# Patient Record
Sex: Male | Born: 1990 | Race: Black or African American | Hispanic: No | Marital: Single | Smoking: Never smoker
Health system: Southern US, Community
[De-identification: ages and names within clinical notes are randomized; demographics above are authoritative.]

## PROBLEM LIST (undated history)

## (undated) DIAGNOSIS — S022XXA Fracture of nasal bones, initial encounter for closed fracture: Secondary | ICD-10-CM

## (undated) DIAGNOSIS — S2231XA Fracture of one rib, right side, initial encounter for closed fracture: Secondary | ICD-10-CM

## (undated) DIAGNOSIS — F0781 Postconcussional syndrome: Secondary | ICD-10-CM

## (undated) DIAGNOSIS — S83006A Unspecified dislocation of unspecified patella, initial encounter: Secondary | ICD-10-CM

## (undated) DIAGNOSIS — Z8739 Personal history of other diseases of the musculoskeletal system and connective tissue: Secondary | ICD-10-CM

## (undated) DIAGNOSIS — J939 Pneumothorax, unspecified: Secondary | ICD-10-CM

## (undated) HISTORY — DX: Postconcussional syndrome: F07.81

## (undated) HISTORY — DX: Unspecified dislocation of unspecified patella, initial encounter: S83.006A

## (undated) HISTORY — DX: Fracture of one rib, right side, initial encounter for closed fracture: S22.31XA

## (undated) HISTORY — DX: Pneumothorax, unspecified: J93.9

## (undated) HISTORY — DX: Fracture of nasal bones, initial encounter for closed fracture: S02.2XXA

## (undated) HISTORY — DX: Personal history of other diseases of the musculoskeletal system and connective tissue: Z87.39

---
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
## 2011-01-01 NOTE — ED Provider Notes (Signed)
Medical screening examination/treatment/procedure(s) were performed by non-physician practitioner and as supervising physician I was immediately available for consultation/collaboration.  Sherina Stammer 01/01/11 0714 

## 2011-04-10 ENCOUNTER — Emergency Department (HOSPITAL_COMMUNITY): Payer: Managed Care, Other (non HMO)

## 2011-04-10 ENCOUNTER — Inpatient Hospital Stay (HOSPITAL_COMMUNITY): Payer: Managed Care, Other (non HMO)

## 2011-04-10 ENCOUNTER — Encounter (HOSPITAL_COMMUNITY): Payer: Self-pay

## 2011-04-10 ENCOUNTER — Emergency Department (HOSPITAL_COMMUNITY)
Admission: EM | Admit: 2011-04-10 | Discharge: 2011-04-10 | Disposition: A | Discharge status: Other Acute Inpatient Hospital | Payer: Managed Care, Other (non HMO) | Source: Home / Self Care

## 2011-04-10 ENCOUNTER — Inpatient Hospital Stay (HOSPITAL_COMMUNITY)
Admission: AD | Admit: 2011-04-10 | Discharge: 2011-04-15 | DRG: 200 | Disposition: A | Discharge status: Home / Self Care | Payer: Managed Care, Other (non HMO) | Source: Other Acute Inpatient Hospital | Attending: General Surgery | Admitting: General Surgery

## 2011-04-10 DIAGNOSIS — J939 Pneumothorax, unspecified: Secondary | ICD-10-CM | POA: Diagnosis present

## 2011-04-10 DIAGNOSIS — S2231XA Fracture of one rib, right side, initial encounter for closed fracture: Secondary | ICD-10-CM | POA: Diagnosis present

## 2011-04-10 DIAGNOSIS — S270XXA Traumatic pneumothorax, initial encounter: Secondary | ICD-10-CM

## 2011-04-10 DIAGNOSIS — R339 Retention of urine, unspecified: Secondary | ICD-10-CM | POA: Diagnosis not present

## 2011-04-10 DIAGNOSIS — S2239XA Fracture of one rib, unspecified side, initial encounter for closed fracture: Secondary | ICD-10-CM | POA: Diagnosis present

## 2011-04-10 DIAGNOSIS — S022XXA Fracture of nasal bones, initial encounter for closed fracture: Secondary | ICD-10-CM | POA: Diagnosis present

## 2011-04-10 DIAGNOSIS — Y92410 Unspecified street and highway as the place of occurrence of the external cause: Secondary | ICD-10-CM

## 2011-04-10 DIAGNOSIS — S0990XA Unspecified injury of head, initial encounter: Secondary | ICD-10-CM | POA: Insufficient documentation

## 2011-04-10 DIAGNOSIS — Y998 Other external cause status: Secondary | ICD-10-CM

## 2011-04-10 DIAGNOSIS — H6120 Impacted cerumen, unspecified ear: Secondary | ICD-10-CM | POA: Diagnosis present

## 2011-04-10 DIAGNOSIS — T07XXXA Unspecified multiple injuries, initial encounter: Secondary | ICD-10-CM | POA: Diagnosis present

## 2011-04-10 DIAGNOSIS — F0781 Postconcussional syndrome: Secondary | ICD-10-CM | POA: Diagnosis present

## 2011-04-10 DIAGNOSIS — R0989 Other specified symptoms and signs involving the circulatory and respiratory systems: Secondary | ICD-10-CM | POA: Insufficient documentation

## 2011-04-10 DIAGNOSIS — R0609 Other forms of dyspnea: Secondary | ICD-10-CM | POA: Insufficient documentation

## 2011-04-10 DIAGNOSIS — IMO0002 Reserved for concepts with insufficient information to code with codable children: Secondary | ICD-10-CM

## 2011-04-10 DIAGNOSIS — S060X0A Concussion without loss of consciousness, initial encounter: Secondary | ICD-10-CM | POA: Diagnosis present

## 2011-04-10 DIAGNOSIS — J9383 Other pneumothorax: Secondary | ICD-10-CM | POA: Insufficient documentation

## 2011-04-10 DIAGNOSIS — S060X9A Concussion with loss of consciousness of unspecified duration, initial encounter: Secondary | ICD-10-CM

## 2011-04-10 DIAGNOSIS — S01501A Unspecified open wound of lip, initial encounter: Secondary | ICD-10-CM | POA: Diagnosis present

## 2011-04-10 LAB — TYPE AND SCREEN
ABO/RH(D): O POS
Antibody Screen: NEGATIVE

## 2011-04-10 LAB — CBC
MCV: 87.5 fL (ref 78.0–100.0)
Platelets: 195 10*3/uL (ref 150–400)
RBC: 5.03 MIL/uL (ref 4.22–5.81)
RDW: 12.4 % (ref 11.5–15.5)
WBC: 8.5 10*3/uL (ref 4.0–10.5)

## 2011-04-10 LAB — COMPREHENSIVE METABOLIC PANEL
Albumin: 4.3 g/dL (ref 3.5–5.2)
Alkaline Phosphatase: 75 U/L (ref 39–117)
BUN: 9 mg/dL (ref 6–23)
Calcium: 9.1 mg/dL (ref 8.4–10.5)
Creatinine, Ser: 0.9 mg/dL (ref 0.50–1.35)
GFR calc Af Amer: >90 mL/min (ref >90)
Glucose, Bld: 122 mg/dL — ABNORMAL HIGH (ref 70–99)
Total Protein: 7.4 g/dL (ref 6.0–8.3)

## 2011-04-10 LAB — PROTIME-INR: Prothrombin Time: 14.7 seconds (ref 11.6–15.2)

## 2011-04-10 LAB — POCT I-STAT, CHEM 8
BUN: 9 mg/dL (ref 6–23)
Calcium, Ion: 1.16 mmol/L (ref 1.12–1.32)
HCT: 47 % (ref 39.0–52.0)
Hemoglobin: 16 g/dL (ref 13.0–17.0)
Sodium: 142 mEq/L (ref 135–145)
TCO2: 25 mmol/L (ref 0–100)

## 2011-04-10 LAB — ABO/RH: ABO/RH(D): O POS

## 2011-04-10 LAB — LACTIC ACID, PLASMA: Lactic Acid, Venous: 2.2 mmol/L (ref 0.5–2.2)

## 2011-04-10 MED ORDER — IOHEXOL 300 MG/ML  SOLN: 100.0000 mL | Freq: Once | INTRAMUSCULAR | Status: DC | PRN

## 2011-04-11 ENCOUNTER — Inpatient Hospital Stay (HOSPITAL_COMMUNITY): Payer: Managed Care, Other (non HMO)

## 2011-04-12 ENCOUNTER — Inpatient Hospital Stay (HOSPITAL_COMMUNITY): Payer: Managed Care, Other (non HMO)

## 2011-04-13 ENCOUNTER — Inpatient Hospital Stay (HOSPITAL_COMMUNITY): Payer: Managed Care, Other (non HMO)

## 2011-04-13 MED ORDER — ONDANSETRON HCL 4 MG/2ML IJ SOLN: 4.0000 mg | Freq: Four times a day (QID) | INTRAMUSCULAR | Status: DC | PRN

## 2011-04-13 MED ORDER — MORPHINE SULFATE 2 MG/ML IJ SOLN: 2.0000 mg | INTRAMUSCULAR | Status: DC | PRN

## 2011-04-13 MED ORDER — HYDROCODONE-ACETAMINOPHEN 5-325 MG PO TABS: 1.0000 | ORAL_TABLET | ORAL | Status: DC | PRN

## 2011-04-13 MED ORDER — SODIUM CHLORIDE 0.9 % IJ SOLN
3.0000 mL | Freq: Two times a day (BID) | INTRAMUSCULAR | Status: DC
  Administered 2011-04-14 (×2): 3 mL via INTRAVENOUS

## 2011-04-13 MED ORDER — ONDANSETRON HCL 4 MG PO TABS: 4.0000 mg | ORAL_TABLET | Freq: Four times a day (QID) | ORAL | Status: DC | PRN

## 2011-04-13 MED ORDER — HYDROCODONE-ACETAMINOPHEN 5-325 MG PO TABS: 2.0000 | ORAL_TABLET | ORAL | Status: DC | PRN

## 2011-04-13 MED ORDER — HYDROCODONE-ACETAMINOPHEN 5-325 MG PO TABS: 0.5000 | ORAL_TABLET | ORAL | Status: DC | PRN

## 2011-04-13 MED ORDER — ENOXAPARIN SODIUM 40 MG/0.4ML ~~LOC~~ SOLN
40.0000 mg | SUBCUTANEOUS | Status: DC
  Administered 2011-04-14: 40 mg via SUBCUTANEOUS
  Filled 2011-04-13 (×2): qty 0.4

## 2011-04-14 ENCOUNTER — Inpatient Hospital Stay (HOSPITAL_COMMUNITY): Payer: Managed Care, Other (non HMO)

## 2011-04-14 DIAGNOSIS — S022XXA Fracture of nasal bones, initial encounter for closed fracture: Secondary | ICD-10-CM

## 2011-04-14 DIAGNOSIS — S2231XA Fracture of one rib, right side, initial encounter for closed fracture: Secondary | ICD-10-CM

## 2011-04-14 DIAGNOSIS — F0781 Postconcussional syndrome: Secondary | ICD-10-CM

## 2011-04-14 DIAGNOSIS — J939 Pneumothorax, unspecified: Secondary | ICD-10-CM | POA: Diagnosis present

## 2011-04-14 HISTORY — DX: Pneumothorax, unspecified: J93.9

## 2011-04-14 HISTORY — DX: Fracture of one rib, right side, initial encounter for closed fracture: S22.31XA

## 2011-04-14 HISTORY — DX: Fracture of nasal bones, initial encounter for closed fracture: S02.2XXA

## 2011-04-14 HISTORY — DX: Postconcussional syndrome: F07.81
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
## 2011-04-15 ENCOUNTER — Inpatient Hospital Stay (HOSPITAL_COMMUNITY): Payer: Managed Care, Other (non HMO)

## 2011-04-15 NOTE — Discharge Summary (Signed)
This patient has been seen and I agree with the findings and treatment plan.  Kou Gucciardo O. Mekayla Soman, III, MD, FACS [phone] (pager) [phone] (direct pager) Trauma Surgeon  
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
## 2011-04-15 NOTE — Progress Notes (Signed)
FMLA paperwork completed for pt's mother. 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

## 2011-04-15 NOTE — Progress Notes (Signed)
Discharge instructions reviewed with pt and mother. Denies any questions. D/C instructions and f/u visit card given to pt. D/C'd to home via wheelchair.
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
## 2011-04-17 NOTE — Progress Notes (Signed)
Utilization review completed. Jermaine Matthew Diane11/12/2010  

## 2011-04-25 ENCOUNTER — Encounter (INDEPENDENT_AMBULATORY_CARE_PROVIDER_SITE_OTHER): Payer: Self-pay

## 2011-04-25 ENCOUNTER — Ambulatory Visit (INDEPENDENT_AMBULATORY_CARE_PROVIDER_SITE_OTHER): Payer: Managed Care, Other (non HMO) | Admitting: Orthopedic Surgery

## 2011-04-25 ENCOUNTER — Encounter: Payer: Self-pay | Admitting: Orthopedic Surgery

## 2011-04-25 DIAGNOSIS — J939 Pneumothorax, unspecified: Secondary | ICD-10-CM

## 2011-04-25 DIAGNOSIS — S2239XA Fracture of one rib, unspecified side, initial encounter for closed fracture: Secondary | ICD-10-CM

## 2011-04-25 DIAGNOSIS — J9383 Other pneumothorax: Secondary | ICD-10-CM

## 2011-04-25 DIAGNOSIS — S060X9A Concussion with loss of consciousness of unspecified duration, initial encounter: Secondary | ICD-10-CM

## 2011-04-25 DIAGNOSIS — S2231XA Fracture of one rib, right side, initial encounter for closed fracture: Secondary | ICD-10-CM

## 2011-04-25 DIAGNOSIS — S060XAA Concussion with loss of consciousness status unknown, initial encounter: Secondary | ICD-10-CM
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
## 2011-04-25 NOTE — Patient Instructions (Signed)
Wash wound daily and shower with soap and water. Cover with dry dressing.

## 2011-07-08 ENCOUNTER — Encounter (HOSPITAL_COMMUNITY): Payer: Self-pay | Admitting: Emergency Medicine

## 2011-07-08 ENCOUNTER — Emergency Department (HOSPITAL_COMMUNITY): Payer: BC Managed Care – PPO

## 2011-07-08 ENCOUNTER — Emergency Department (HOSPITAL_COMMUNITY)
Admission: EM | Admit: 2011-07-08 | Discharge: 2011-07-08 | Disposition: A | Discharge status: Home / Self Care | Payer: BC Managed Care – PPO | Attending: Emergency Medicine | Admitting: Emergency Medicine

## 2011-07-08 ENCOUNTER — Other Ambulatory Visit: Payer: Self-pay

## 2011-07-08 DIAGNOSIS — R0602 Shortness of breath: Secondary | ICD-10-CM | POA: Insufficient documentation

## 2011-07-08 NOTE — ED Notes (Signed)
Feels like it hard to take a deep breath  On and off for the last few days worse after leaving the gym this am
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
## 2011-08-07 ENCOUNTER — Emergency Department (HOSPITAL_COMMUNITY)
Admission: EM | Admit: 2011-08-07 | Discharge: 2011-08-07 | Disposition: A | Discharge status: Home / Self Care | Payer: Managed Care, Other (non HMO) | Attending: Emergency Medicine | Admitting: Emergency Medicine

## 2011-08-07 ENCOUNTER — Encounter (HOSPITAL_COMMUNITY): Payer: Self-pay | Admitting: *Deleted

## 2011-08-07 DIAGNOSIS — IMO0002 Reserved for concepts with insufficient information to code with codable children: Secondary | ICD-10-CM | POA: Insufficient documentation

## 2011-08-07 DIAGNOSIS — T1490XA Injury, unspecified, initial encounter: Secondary | ICD-10-CM | POA: Insufficient documentation

## 2011-08-07 DIAGNOSIS — H5789 Other specified disorders of eye and adnexa: Secondary | ICD-10-CM | POA: Insufficient documentation

## 2011-08-07 DIAGNOSIS — H579 Unspecified disorder of eye and adnexa: Secondary | ICD-10-CM | POA: Insufficient documentation

## 2011-08-07 MED ORDER — IBUPROFEN 600 MG PO TABS: 600.0000 mg | ORAL_TABLET | Freq: Four times a day (QID) | ORAL | Status: DC | PRN

## 2011-08-07 MED ORDER — TETRACAINE HCL 0.5 % OP SOLN
2.0000 [drp] | Freq: Once | OPHTHALMIC | Status: AC
  Administered 2011-08-07: 2 [drp] via OPHTHALMIC
  Filled 2011-08-07: qty 2

## 2011-08-07 MED ORDER — LACRI-LUBE S.O.P. OP OINT: 1.0000 [in_us] | TOPICAL_OINTMENT | Freq: Four times a day (QID) | OPHTHALMIC | Status: DC | PRN

## 2011-08-07 MED ORDER — FLUORESCEIN SODIUM 1 MG OP STRP
1.0000 | ORAL_STRIP | Freq: Once | OPHTHALMIC | Status: AC
  Administered 2011-08-07: 1 via OPHTHALMIC
  Filled 2011-08-07: qty 1

## 2011-08-07 MED ORDER — FLUORESCEIN SODIUM 1 MG OP STRP
ORAL_STRIP | OPHTHALMIC | Status: AC
  Filled 2011-08-07: qty 1
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
## 2011-08-07 NOTE — ED Notes (Signed)
Pt flushing out eye with NS.

## 2011-08-07 NOTE — ED Provider Notes (Signed)
  I performed a history and physical examination of Cecille Amsterdam and discussed his management with Dr. Fayrene Fearing.  I agree with the history, physical, assessment, and plan of care, with the following exceptions: None   I've seen and evaluated the patient who appears to be in no distress, with no erythema or swelling to the conjunctiva or surrounding tissues of the eye. He has no tearing, and appears to be in no distress with regard to his eyes. I examined the patient's eyes using tetracaine and fluorescein and slit lamp, and I see no corneal abrasions, corneal foreign bodies, or other abnormality.   I was present for the following procedures: None Time Spent in Critical Care of the patient: None Time spent in discussions with the patient and family: 10 minutes  Wister Hoefle D    Felisa Bonier, MD 08/07/11 [phone]
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
## 2011-08-08 NOTE — ED Provider Notes (Signed)
Evaluation and management procedures were performed by the PA/NP under my supervision/collaboration.  I evaluated this patient face-to-face at the time of encounter.  Please see my note dated at that time.   Kyelle Urbas D Andreea Arca, MD 08/08/11 0004 

## 2011-08-11 ENCOUNTER — Emergency Department (HOSPITAL_COMMUNITY)
Admission: EM | Admit: 2011-08-11 | Discharge: 2011-08-11 | Disposition: A | Discharge status: Home / Self Care | Payer: No Typology Code available for payment source | Attending: Emergency Medicine | Admitting: Emergency Medicine

## 2011-08-11 ENCOUNTER — Encounter (HOSPITAL_COMMUNITY): Payer: Self-pay | Admitting: *Deleted

## 2011-08-11 ENCOUNTER — Emergency Department (HOSPITAL_COMMUNITY): Payer: No Typology Code available for payment source

## 2011-08-11 DIAGNOSIS — M79609 Pain in unspecified limb: Secondary | ICD-10-CM | POA: Insufficient documentation

## 2011-08-11 DIAGNOSIS — S60459A Superficial foreign body of unspecified finger, initial encounter: Secondary | ICD-10-CM | POA: Insufficient documentation

## 2011-08-11 DIAGNOSIS — S60451A Superficial foreign body of left index finger, initial encounter: Secondary | ICD-10-CM

## 2011-08-11 MED ORDER — NAPROXEN 500 MG PO TABS: 500.0000 mg | ORAL_TABLET | Freq: Two times a day (BID) | ORAL | Status: AC

## 2011-08-11 NOTE — ED Notes (Signed)
Received bedside report from [place], [place].  Patient currently sitting up in bed; no respiratory or acute distress noted.  Patient updated on plan of care; informed patient that we are currently waiting on discharge paperwork from [facility].  Patient has no other questions or concerns at this time; will continue to monitor.

## 2011-08-11 NOTE — ED Notes (Signed)
Patient denies pain and is resting comfortably.  
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
## 2011-08-30 ENCOUNTER — Encounter (HOSPITAL_BASED_OUTPATIENT_CLINIC_OR_DEPARTMENT_OTHER): Payer: Self-pay

## 2011-08-30 ENCOUNTER — Emergency Department (HOSPITAL_BASED_OUTPATIENT_CLINIC_OR_DEPARTMENT_OTHER)
Admission: EM | Admit: 2011-08-30 | Discharge: 2011-08-30 | Disposition: A | Discharge status: Home / Self Care | Payer: Managed Care, Other (non HMO) | Attending: Emergency Medicine | Admitting: Emergency Medicine

## 2011-08-30 DIAGNOSIS — R1110 Vomiting, unspecified: Secondary | ICD-10-CM | POA: Insufficient documentation

## 2011-08-30 DIAGNOSIS — K529 Noninfective gastroenteritis and colitis, unspecified: Secondary | ICD-10-CM

## 2011-08-30 DIAGNOSIS — K5289 Other specified noninfective gastroenteritis and colitis: Secondary | ICD-10-CM | POA: Insufficient documentation

## 2011-08-30 DIAGNOSIS — R197 Diarrhea, unspecified: Secondary | ICD-10-CM | POA: Insufficient documentation

## 2011-08-30 MED ORDER — ONDANSETRON HCL 4 MG/2ML IJ SOLN
4.0000 mg | Freq: Once | INTRAMUSCULAR | Status: AC
  Administered 2011-08-30: 4 mg via INTRAVENOUS
  Filled 2011-08-30: qty 2

## 2011-08-30 MED ORDER — ONDANSETRON HCL 4 MG PO TABS: 4.0000 mg | ORAL_TABLET | Freq: Four times a day (QID) | ORAL | Status: AC

## 2011-08-30 MED ORDER — SODIUM CHLORIDE 0.9 % IV BOLUS (SEPSIS)
1000.0000 mL | Freq: Once | INTRAVENOUS | Status: AC
  Administered 2011-08-30: 1000 mL via INTRAVENOUS

## 2011-08-30 NOTE — ED Provider Notes (Addendum)
History     CSN: [id number]  Arrival date & time 08/30/11  1655   First MD Initiated Contact with Patient 08/30/11 1706      No chief complaint on file.   (Consider location/radiation/quality/duration/timing/severity/associated sxs/prior treatment) HPI Comments: No hx of recent foreign travel, camping, or abx use.  Patient is a 21 y.o. male presenting with vomiting. The history is provided by the patient.  Emesis  This is a new problem. The current episode started yesterday. The problem occurs 5 to 10 times per day. The problem has been gradually improving. The emesis has an appearance of stomach contents. There has been no fever. Associated symptoms include diarrhea. Pertinent negatives include no abdominal pain, no arthralgias, no chills, no cough, no fever, no headaches, no myalgias and no URI. Risk factors include ill contacts (multiple family members with same symptoms).    Past Medical History  Diagnosis Date  . Pneumothorax   . Concussion     History reviewed. No pertinent past surgical history.  No family history on file.  History  Substance Use Topics  . Smoking status: Never Smoker   . Smokeless tobacco: Never Used  . Alcohol Use: No      Review of Systems  Constitutional: Negative for fever, chills, diaphoresis and fatigue.  HENT: Negative for congestion, rhinorrhea and sneezing.   Eyes: Negative.   Respiratory: Negative for cough, chest tightness and shortness of breath.   Cardiovascular: Negative for chest pain and leg swelling.  Gastrointestinal: Positive for nausea, vomiting and diarrhea. Negative for abdominal pain and blood in stool.  Genitourinary: Negative for frequency, hematuria, flank pain and difficulty urinating.  Musculoskeletal: Negative for myalgias, back pain and arthralgias.  Skin: Negative for rash.  Neurological: Negative for dizziness, speech difficulty, weakness, numbness and headaches.    Allergies  Theraflu flu &  Home  Medications   Current Outpatient Rx  Name Route Sig Dispense Refill  . NAPROXEN 500 MG PO TABS Oral Take 1 tablet (500 mg total) by mouth 2 (two) times daily. 30 tablet 0  . ONDANSETRON HCL 4 MG PO TABS Oral Take 1 tablet (4 mg total) by mouth every 6 (six) hours. 12 tablet 0    BP 112/65  Pulse 88  Temp(Src) 98.9 F (37.2 C) (Oral)  Resp 16  Ht 5' 10.5" (1.791 m)  Wt 155 lb (70.308 kg)  BMI 21.93 kg/m2  SpO2 99%  Physical Exam  Constitutional: He is oriented to person, place, and time. He appears well-developed and well-nourished.  HENT:  Head: Normocephalic and atraumatic.  Eyes: Pupils are equal, round, and reactive to light.  Neck: Normal range of motion. Neck supple.  Cardiovascular: Normal rate, regular rhythm and normal heart sounds.   Pulmonary/Chest: Effort normal and breath sounds normal. No respiratory distress. He has no wheezes. He has no rales. He exhibits no tenderness.  Abdominal: Soft. Bowel sounds are normal. There is no tenderness. There is no rebound and no guarding.  Musculoskeletal: Normal range of motion. He exhibits no edema.  Lymphadenopathy:    He has no cervical adenopathy.  Neurological: He is alert and oriented to person, place, and time.  Skin: Skin is warm and dry. No rash noted.  Psychiatric: He has a normal mood and affect.    ED Course  Procedures (including critical care time)  Labs Reviewed - No data to display No results found.   1. Gastroenteritis       MDM  Pt with symptoms consistent with gastroenteritis,  likely viral.  No abd pain on exam.  No bloody diarrhea.  Feeling better after IVFs and zofran.        Rolan Bucco, MD 08/30/11 1900  Rolan Bucco, MD 08/30/11 1901

## 2011-08-30 NOTE — Discharge Instructions (Signed)
B.R.A.T. Diet Your doctor has recommended the B.R.A.T. diet for you or your child until the condition improves. This is often used to help control diarrhea and vomiting symptoms. If you or your child can tolerate clear liquids, you may have:  Bananas.   Rice.   Applesauce.   Toast (and other simple starches such as crackers, potatoes, noodles).  Be sure to avoid dairy products, meats, and fatty foods until symptoms are better. Fruit juices such as apple, grape, and prune juice can make diarrhea worse. Avoid these. Continue this diet for 2 days or as instructed by your caregiver. Document Released: 05/27/2005 Document Revised: 05/16/2011 Document Reviewed: 11/13/2006 Exit[employer] Patient Information 2012 [employer].Viral Gastroenteritis Viral gastroenteritis is also known as stomach flu. This condition affects the stomach and intestinal tract. It can cause sudden diarrhea and vomiting. The illness typically lasts 3 to 8 days. Most people develop an immune response that eventually gets rid of the virus. While this natural response develops, the virus can make you quite ill. CAUSES  Many different viruses can cause gastroenteritis, such as rotavirus or noroviruses. You can catch one of these viruses by consuming contaminated food or water. You may also catch a virus by sharing utensils or other personal items with an infected person or by touching a contaminated surface. SYMPTOMS  The most common symptoms are diarrhea and vomiting. These problems can cause a severe loss of body fluids (dehydration) and a body salt (electrolyte) imbalance. Other symptoms may include:  Fever.   Headache.   Fatigue.   Abdominal pain.  DIAGNOSIS  Your caregiver can usually diagnose viral gastroenteritis based on your symptoms and a physical exam. A stool sample may also be taken to test for the presence of viruses or other infections. TREATMENT  This illness typically goes away on its own. Treatments are aimed  at rehydration. The most serious cases of viral gastroenteritis involve vomiting so severely that you are not able to keep fluids down. In these cases, fluids must be given through an intravenous line (IV). HOME CARE INSTRUCTIONS   Drink enough fluids to keep your urine clear or pale yellow. Drink small amounts of fluids frequently and increase the amounts as tolerated.   Ask your caregiver for specific rehydration instructions.   Avoid:   Foods high in sugar.   Alcohol.   Carbonated drinks.   Tobacco.   Juice.   Caffeine drinks.   Extremely hot or cold fluids.   Fatty, greasy foods.   Too much intake of anything at one time.   Dairy products until 24 to 48 hours after diarrhea stops.   You may consume probiotics. Probiotics are active cultures of beneficial bacteria. They may lessen the amount and number of diarrheal stools in adults. Probiotics can be found in yogurt with active cultures and in supplements.   Wash your hands well to avoid spreading the virus.   Only take over-the-counter or prescription medicines for pain, discomfort, or fever as directed by your caregiver. Do not give aspirin to children. Antidiarrheal medicines are not recommended.   Ask your caregiver if you should continue to take your regular prescribed and over-the-counter medicines.   Keep all follow-up appointments as directed by your caregiver.  SEEK IMMEDIATE MEDICAL CARE IF:   You are unable to keep fluids down.   You do not urinate at least once every 6 to 8 hours.   You develop shortness of breath.   You notice blood in your stool or vomit. This may   look like coffee grounds.   You have abdominal pain that increases or is concentrated in one small area (localized).   You have persistent vomiting or diarrhea.   You have a fever.   The patient is a child younger than 3 months, and he or she has a fever.   The patient is a child older than 3 months, and he or she has a fever and  persistent symptoms.   The patient is a child older than 3 months, and he or she has a fever and symptoms suddenly get worse.   The patient is a baby, and he or she has no tears when crying.  MAKE SURE YOU:   Understand these instructions.   Will watch your condition.   Will get help right away if you are not doing well or get worse.  Document Released: 05/27/2005 Document Revised: 05/16/2011 Document Reviewed: 03/13/2011 Exit[employer] Patient Information 2012 [employer]. 

## 2012-02-13 ENCOUNTER — Emergency Department (HOSPITAL_BASED_OUTPATIENT_CLINIC_OR_DEPARTMENT_OTHER): Payer: BC Managed Care – PPO

## 2012-02-13 ENCOUNTER — Encounter (HOSPITAL_BASED_OUTPATIENT_CLINIC_OR_DEPARTMENT_OTHER): Payer: Self-pay | Admitting: *Deleted

## 2012-02-13 ENCOUNTER — Emergency Department (HOSPITAL_BASED_OUTPATIENT_CLINIC_OR_DEPARTMENT_OTHER)
Admission: EM | Admit: 2012-02-13 | Discharge: 2012-02-13 | Disposition: A | Discharge status: Home / Self Care | Payer: BC Managed Care – PPO | Attending: Emergency Medicine | Admitting: Emergency Medicine

## 2012-02-13 DIAGNOSIS — M25469 Effusion, unspecified knee: Secondary | ICD-10-CM | POA: Insufficient documentation

## 2012-02-13 MED ORDER — IBUPROFEN 600 MG PO TABS: 600.0000 mg | ORAL_TABLET | Freq: Four times a day (QID) | ORAL | Status: AC | PRN

## 2012-02-13 NOTE — ED Notes (Signed)
Patient reports he has used crutches in the past; crutches adjusted for patient's height.  Patient ambulatory around the room; patient verbalized comfort with using the crutches.

## 2012-02-13 NOTE — ED Notes (Signed)
Pt reports having problems with his left knee "since 5th grade" when he dislocated his kneecap for the first time playing basketball. Pt states he was playing basketball yesterday and once again dislocated his left kneecap. Pt states "it pops back in on it's own, but I wonder if I need surgery"

## 2012-02-13 NOTE — ED Provider Notes (Signed)
History     CSN: [id number]  Arrival date & time 02/13/12  0905   First MD Initiated Contact with Patient 02/13/12 [phone]      Chief Complaint  Patient presents with  . Knee Pain     Patient is a 21 y.o. male presenting with knee pain. The history is provided by the patient.  Knee Pain This is a new problem. The current episode started yesterday. The problem occurs constantly. The problem has not changed since onset.Pertinent negatives include no chest pain and no shortness of breath. The symptoms are aggravated by standing. The symptoms are relieved by rest. He has tried rest for the symptoms. The treatment provided moderate relief.  pt reports playing basketball yesterday and twisted left knee and he thinks he dislocated patella, but he was able to reduce on his own and continue playing.  Denies direct trauma to knee.  He now reports swelling to knee.  No h/o knees surgery but reports h/o osgood schlatter disease   Past Medical History  Diagnosis Date  . Pneumothorax   . Concussion     No past surgical history on file.  No family history on file.  History  Substance Use Topics  . Smoking status: Never Smoker   . Smokeless tobacco: Never Used  . Alcohol Use: No      Review of Systems  Respiratory: Negative for shortness of breath.   Cardiovascular: Negative for chest pain.    Allergies  Pheniramine-pe-apap  Home Medications   Current Outpatient Rx  Name Route Sig Dispense Refill  . IBUPROFEN 600 MG PO TABS Oral Take 1 tablet (600 mg total) by mouth every 6 (six) hours as needed for pain. 30 tablet 0  . NAPROXEN 500 MG PO TABS Oral Take 1 tablet (500 mg total) by mouth 2 (two) times daily. 30 tablet 0    BP 122/79  Pulse 76  Temp 98.2 F (36.8 C) (Oral)  Resp 18  Ht 5' 11.5" (1.816 m)  Wt 162 lb (73.483 kg)  BMI 22.28 kg/m2  SpO2 100%  Physical Exam CONSTITUTIONAL: Well developed/well nourished HEAD AND FACE: Normocephalic/atraumatic EYES:  EOMI/PERRL ENMT: Mucous membranes moist NECK: supple no meningeal signs SPINE:entire spine nontender CV: S1/S2 noted, no murmurs/rubs/gallops noted LUNGS: Lungs are clear to auscultation bilaterally, no apparent distress ABDOMEN: soft, nontender, no rebound or guarding NEURO: Pt is awake/alert, moves all extremitiesx4 EXTREMITIES: pulses normal, full ROM.  Left knee effusion noted but no erythema or deformity. He has ROM of left knee and able to keep knee extended while hip flexed.  Left achilles intact.  Left ankle nontender SKIN: warm, color normal PSYCH: no abnormalities of mood noted  ED Course  Procedures   Labs Reviewed - No data to display Dg Knee Complete 4 Views Left  02/13/2012  *RADIOLOGY REPORT*  Clinical Data: Knee dislocation.  LEFT KNEE - COMPLETE 4+ VIEW  Comparison: Plain films left knee 10/09/2007.  Findings: Moderate joint effusion is seen. No fracture or dislocation is identified. The patella is centered over the lateral femoral trochlea. Old Candis Shine disease is identified.  IMPRESSION:  1. Moderate joint effusion. No fracture.  The patella is centered over the lateral femoral trochlea.  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   Original Report Authenticated By: Jermaine Bell. Obrien, M.D.      1. Knee effusion     Needs sports med f/u Given crutches and limit activity He did not want pain meds  MDM  Nursing notes including past  medical history and social history reviewed and considered in documentation xrays reviewed and considered         Jermaine Gaskins, MD 02/13/12 1030

## 2012-04-29 ENCOUNTER — Ambulatory Visit (INDEPENDENT_AMBULATORY_CARE_PROVIDER_SITE_OTHER): Payer: Self-pay | Admitting: Family Medicine

## 2012-04-29 ENCOUNTER — Encounter: Payer: Self-pay | Admitting: Family Medicine

## 2012-04-29 DIAGNOSIS — R5383 Other fatigue: Secondary | ICD-10-CM

## 2012-04-29 DIAGNOSIS — Z0289 Encounter for other administrative examinations: Secondary | ICD-10-CM

## 2012-04-29 DIAGNOSIS — R5381 Other malaise: Secondary | ICD-10-CM
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
## 2012-05-14 ENCOUNTER — Encounter (HOSPITAL_BASED_OUTPATIENT_CLINIC_OR_DEPARTMENT_OTHER): Payer: Self-pay | Admitting: *Deleted

## 2012-05-14 ENCOUNTER — Emergency Department (HOSPITAL_BASED_OUTPATIENT_CLINIC_OR_DEPARTMENT_OTHER)
Admission: EM | Admit: 2012-05-14 | Discharge: 2012-05-14 | Disposition: A | Discharge status: Home / Self Care | Payer: BC Managed Care – PPO | Attending: Emergency Medicine | Admitting: Emergency Medicine

## 2012-05-14 DIAGNOSIS — Z22322 Carrier or suspected carrier of Methicillin resistant Staphylococcus aureus: Secondary | ICD-10-CM | POA: Insufficient documentation

## 2012-05-14 DIAGNOSIS — Z8709 Personal history of other diseases of the respiratory system: Secondary | ICD-10-CM | POA: Insufficient documentation

## 2012-05-14 DIAGNOSIS — Z87828 Personal history of other (healed) physical injury and trauma: Secondary | ICD-10-CM | POA: Insufficient documentation

## 2012-05-14 DIAGNOSIS — L089 Local infection of the skin and subcutaneous tissue, unspecified: Secondary | ICD-10-CM

## 2012-05-14 MED ORDER — SULFAMETHOXAZOLE-TRIMETHOPRIM 800-160 MG PO TABS: 1.0000 | ORAL_TABLET | Freq: Two times a day (BID) | ORAL | Status: AC
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
## 2012-05-14 NOTE — ED Notes (Signed)
Multiple abscess on his arms and shoulders.

## 2012-05-15 NOTE — ED Provider Notes (Signed)
Medical screening examination/treatment/procedure(s) were performed by non-physician practitioner and as supervising physician I was immediately available for consultation/collaboration.   [name], MD 05/15/12 [phone]

## 2012-08-10 ENCOUNTER — Telehealth: Payer: Self-pay
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
## 2012-08-17 ENCOUNTER — Other Ambulatory Visit: Payer: Self-pay | Admitting: Family Medicine

## 2012-08-18 NOTE — Telephone Encounter (Signed)
Notified pt that letter is ready for p/up. Faxed letter to [facility] at fax # on [facility] letter and left copy for pt p/up.

## 2013-01-04 ENCOUNTER — Encounter (HOSPITAL_BASED_OUTPATIENT_CLINIC_OR_DEPARTMENT_OTHER): Payer: Self-pay | Admitting: Emergency Medicine

## 2013-01-04 ENCOUNTER — Emergency Department (HOSPITAL_BASED_OUTPATIENT_CLINIC_OR_DEPARTMENT_OTHER)
Admission: EM | Admit: 2013-01-04 | Discharge: 2013-01-04 | Disposition: A | Discharge status: Home / Self Care | Payer: BC Managed Care – PPO | Attending: Emergency Medicine | Admitting: Emergency Medicine

## 2013-01-04 DIAGNOSIS — Z8709 Personal history of other diseases of the respiratory system: Secondary | ICD-10-CM | POA: Insufficient documentation

## 2013-01-04 DIAGNOSIS — J029 Acute pharyngitis, unspecified: Secondary | ICD-10-CM

## 2013-01-04 MED ORDER — HYDROCODONE-ACETAMINOPHEN 7.5-325 MG/15ML PO SOLN
10.0000 mg/kg | Freq: Once | ORAL | Status: DC
  Filled 2013-01-04: qty 15

## 2013-01-04 MED ORDER — HYDROCODONE-ACETAMINOPHEN 7.5-325 MG/15ML PO SOLN: 10.0000 mL | Freq: Once | ORAL | Status: DC

## 2013-01-04 MED ORDER — HYDROCODONE-ACETAMINOPHEN 7.5-325 MG/15ML PO SOLN: 10.0000 mL | ORAL | Status: DC | PRN

## 2013-01-04 MED ORDER — HYDROCODONE-ACETAMINOPHEN 7.5-325 MG/15ML PO SOLN
10.0000 mL | Freq: Once | ORAL | Status: AC
  Administered 2013-01-04: 10 mL via ORAL

## 2013-01-04 NOTE — ED Notes (Signed)
Sore throat x2 days denies fever or body aches taking OTC Cepacol w/o relief reports increased pain with swallowing

## 2013-01-04 NOTE — ED Provider Notes (Signed)
  CSN: [id number]     Arrival date & time 01/04/13  0459 History     First MD Initiated Contact with Patient 01/04/13 [phone]     Chief Complaint  Patient presents with  . Sore Throat   (Consider location/radiation/quality/duration/timing/severity/associated sxs/prior Treatment) HPI This is a 22 year old male with a two-day history of sore throat. He states the pain is so severe that he has been unable to either drink the last 24 hours. The pain is worse with swallowing. He denies fever, nasal congestion, cough, shortness of breath, nausea, vomiting, abdominal pain or diarrhea. He has been taking Cepacol throat lozenges without relief. He is also having pain in his left jugulodigastric lymph node. This pain radiates to his left ear.   Past Medical History  Diagnosis Date  . Pneumothorax   . Concussion    History reviewed. No pertinent past surgical history. History reviewed. No pertinent family history. History  Substance Use Topics  . Smoking status: Never Smoker   . Smokeless tobacco: Never Used  . Alcohol Use: No    Review of Systems  All other systems reviewed and are negative.    Allergies  Pheniramine-pe-apap  Home Medications  No current outpatient prescriptions on file. BP 126/70  Pulse 64  Temp(Src) 98.3 F (36.8 C) (Oral)  Resp 16  Ht 5\' 10"  (1.778 m)  Wt 160 lb (72.576 kg)  BMI 22.96 kg/m2  SpO2 100%  Physical Exam General: Well-developed, well-nourished male in no acute distress; appearance consistent with age of record HENT: normocephalic, atraumatic; pharyngeal erythema without edema Eyes: pupils equal round and reactive to light; extraocular muscles intact Neck: supple; anterior cervical lymphadenopathy, left greater than right Heart: regular rate and rhythm Lungs: clear to auscultation bilaterally Abdomen: soft; nondistended; nontender; no masses or hepatosplenomegaly; bowel sounds present Extremities: No deformity; full range of motion; no  edema Neurologic: Awake, alert and oriented; motor function intact in all extremities and symmetric; no facial droop Skin: Warm and dry Psychiatric: Normal mood and affect    ED Course   Procedures (including critical care time)    MDM   Nursing notes and vitals signs, including pulse oximetry, reviewed.  Summary of this visit's results, reviewed by myself:  Labs:  Results for orders placed during the hospital encounter of 01/04/13 (from the past 24 hour(s))  RAPID STREP SCREEN     Status: None   Collection Time    01/04/13  5:19 AM      Result Value Range   Streptococcus, Group A Screen (Direct) NEGATIVE  NEGATIVE     Hanley Seamen, MD 01/04/13 [phone]

## 2013-01-06 LAB — CULTURE, GROUP A STREP

## 2013-02-02 ENCOUNTER — Ambulatory Visit (INDEPENDENT_AMBULATORY_CARE_PROVIDER_SITE_OTHER): Payer: BC Managed Care – PPO | Admitting: Family Medicine

## 2013-02-02 DIAGNOSIS — S270XXA Traumatic pneumothorax, initial encounter: Secondary | ICD-10-CM

## 2013-02-02 NOTE — Patient Instructions (Signed)
Call to schedule a physical at your convenience with Dr. Neva Seat.  If any other paperwork needed.
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
## 2013-09-22 ENCOUNTER — Emergency Department (HOSPITAL_BASED_OUTPATIENT_CLINIC_OR_DEPARTMENT_OTHER): Payer: BC Managed Care – PPO

## 2013-09-22 ENCOUNTER — Encounter (HOSPITAL_BASED_OUTPATIENT_CLINIC_OR_DEPARTMENT_OTHER): Payer: Self-pay | Admitting: Emergency Medicine

## 2013-09-22 ENCOUNTER — Emergency Department (HOSPITAL_BASED_OUTPATIENT_CLINIC_OR_DEPARTMENT_OTHER)
Admission: EM | Admit: 2013-09-22 | Discharge: 2013-09-22 | Disposition: A | Discharge status: Home / Self Care | Payer: BC Managed Care – PPO | Attending: Emergency Medicine | Admitting: Emergency Medicine

## 2013-09-22 DIAGNOSIS — J9801 Acute bronchospasm: Secondary | ICD-10-CM | POA: Insufficient documentation

## 2013-09-22 DIAGNOSIS — Z87828 Personal history of other (healed) physical injury and trauma: Secondary | ICD-10-CM | POA: Insufficient documentation

## 2013-09-22 MED ORDER — ALBUTEROL SULFATE HFA 108 (90 BASE) MCG/ACT IN AERS
2.0000 | INHALATION_SPRAY | RESPIRATORY_TRACT | Status: DC | PRN
  Administered 2013-09-22: 2 via RESPIRATORY_TRACT
  Filled 2013-09-22: qty 6.7

## 2013-09-22 MED ORDER — CETIRIZINE HCL 10 MG PO TABS: 10.0000 mg | ORAL_TABLET | Freq: Every evening | ORAL | Status: DC | PRN

## 2013-09-22 NOTE — ED Provider Notes (Signed)
CSN: [id number]     Arrival date & time 09/22/13  0114 History   First MD Initiated Contact with Patient 09/22/13 [phone]     Chief Complaint  Patient presents with  . Shortness of Breath     (Consider location/radiation/quality/duration/timing/severity/associated sxs/prior Treatment) HPI This is a 23 year old male with a history of pneumothorax. He is here with difficulty taking a deep breath. He feels like he cannot pull in a full breath. He has had occasional cough which he attributes to seasonal allergies. He denies fever, chest pain, leg swelling or pain. Symptoms are moderate. He has no history of asthma and has never used an inhaled bronchodilator.  Past Medical History  Diagnosis Date  . Pneumothorax   . Concussion    History reviewed. No pertinent past surgical history. History reviewed. No pertinent family history. History  Substance Use Topics  . Smoking status: Never Smoker   . Smokeless tobacco: Never Used  . Alcohol Use: No    Review of Systems  All other systems reviewed and are negative.   Allergies  Pheniramine-pe-apap  Home Medications   Prior to Admission medications   Medication Sig Start Date End Date Taking? Authorizing Provider  HYDROcodone-acetaminophen (HYCET) 7.5-325 mg/15 ml solution Take 10 mLs by mouth every 4 (four) hours as needed for pain. 01/04/13   Tarence Searcy L Veronica Fretz, MD   BP 114/74  Pulse 73  Temp(Src) 97.8 F (36.6 C) (Oral)  Resp 16  Ht 5\' 10"  (1.778 m)  Wt 170 lb (77.111 kg)  BMI 24.39 kg/m2  SpO2 98%  Physical Exam General: Well-developed, well-nourished male in no acute distress; appearance consistent with age of record HENT: normocephalic; atraumatic; pharynx normal Eyes: pupils equal, round and reactive to light; extraocular muscles intact Neck: supple Heart: regular rate and rhythm; no murmurs, rubs or gallops Lungs: Mildly decreased air movement bilaterally without wheezing; coughing Abdomen: soft; nondistended; nontender;  bowel sounds present Extremities: No deformity; full range of motion; pulses normal; no edema Neurologic: Sleepy but arousable; motor function intact in all extremities and symmetric; no facial droop Skin: Warm and dry Psychiatric: Normal mood and affect    ED Course  Procedures (including critical care time) Nursing notes and vitals signs, including pulse oximetry, reviewed.  Summary of this visit's results, reviewed by myself:  Labs:  No results found for this or any previous visit (from the past 24 hour(s)).  Imaging Studies: Dg Chest 2 View  09/22/2013   CLINICAL DATA:  Shortness of breath and history of pneumothorax.  EXAM: CHEST - 2 VIEW  COMPARISON:  DG CHEST 2 VIEW dated 07/08/2011; DG CHEST 1V PORT dated 04/10/2011; DG CHEST 1V PORT dated 04/15/2011  FINDINGS: The heart size and mediastinal contours are within normal limits. Lung volumes are normal. There is no evidence of pulmonary edema, consolidation, pneumothorax, nodule or pleural fluid. The visualized skeletal structures are unremarkable.  IMPRESSION: No active disease.  No evidence of recurrent pneumothorax.   Electronically Signed   By: Irish LackGlenn  Yamagata M.D.   On: 09/22/2013 02:03   3:39 AM Suspect symptoms are due to seasonal allergies. We will provide him an inhaler and instruction on its use.     Hanley SeamenJohn L Charletta Voight, MD 09/22/13 [phone]

## 2013-09-22 NOTE — Discharge Instructions (Signed)
Bronchospasm, Adult °A bronchospasm is when the tubes that carry air in and out of your lungs (airwarys) spasm or tighten. During a bronchospasm it is hard to breathe. This is because the airways get smaller. A bronchospasm can be triggered by: °· Allergies. These may be to animals, pollen, food, or mold. °· Infection. This is a common cause of bronchospasm. °· Exercise. °· Irritants. These include pollution, cigarette smoke, strong odors, aerosol sprays, and paint fumes. °· Weather changes. °· Stress. °· Being emotional. °HOME CARE  °· Always have a plan for getting help. Know when to call your doctor and local emergency services (911 in the [place] Know where you can get emergency care. °· Only take medicines as told by your doctor. °· If you were prescribed an inhaler or nebulizer machine, ask your doctor how to use it correctly. Always use a spacer with your inhaler if you were given one. °· Stay calm during an attack. Try to relax and breathe more slowly. °· Control your home environment: °· Change your heating and air conditioning filter at least once a month. °· Limit your use of fireplaces and wood stoves. °· Do not  smoke. Do not  allow smoking in your home. °· Avoid perfumes and fragrances. °· Get rid of pests (such as roaches and mice) and their droppings. °· Throw away plants if you see mold on them. °· Keep your house clean and dust free. °· Replace carpet with wood, tile, or vinyl flooring. Carpet can trap dander and dust. °· Use allergy-proof pillows, mattress covers, and box spring covers. °· Wash bed sheets and blankets every week in hot water. Dry them in a dryer. °· Use blankets that are made of polyester or cotton. °· Wash hands frequently. °GET HELP IF: °· You have muscle aches. °· You have chest pain. °· The thick spit you spit or cough up (sputum) changes from clear or white to yellow, green, gray, or bloody. °· The thick spit you spit or cough up gets thicker. °· There are problems that may be  related to the medicine you are given such as: °· A rash. °· Itching. °· Swelling. °· Trouble breathing. °GET HELP RIGHT AWAY IF: °· You feel you cannot breathe or catch your breath. °· You cannot stop coughing. °· Your treatment is not helping you breathe better. °MAKE SURE YOU:  °· Understand these instructions. °· Will watch your condition. °· Will get help right away if you are not doing well or get worse. °Document Released: 03/24/2009 Document Revised: 01/27/2013 Document Reviewed: 11/17/2012 °ExitCare® Patient Information ©2014 [employer]. ° °

## 2013-09-22 NOTE — ED Notes (Signed)
Pt states he is unable to take a deep breath, denies cough or fever.

## 2013-11-20 ENCOUNTER — Encounter (HOSPITAL_BASED_OUTPATIENT_CLINIC_OR_DEPARTMENT_OTHER): Payer: Self-pay | Admitting: Emergency Medicine

## 2013-11-20 ENCOUNTER — Emergency Department (HOSPITAL_BASED_OUTPATIENT_CLINIC_OR_DEPARTMENT_OTHER): Payer: BC Managed Care – PPO

## 2013-11-20 ENCOUNTER — Emergency Department (HOSPITAL_BASED_OUTPATIENT_CLINIC_OR_DEPARTMENT_OTHER)
Admission: EM | Admit: 2013-11-20 | Discharge: 2013-11-20 | Disposition: A | Discharge status: Home / Self Care | Payer: BC Managed Care – PPO | Attending: Emergency Medicine | Admitting: Emergency Medicine

## 2013-11-20 DIAGNOSIS — Y9239 Other specified sports and athletic area as the place of occurrence of the external cause: Secondary | ICD-10-CM | POA: Insufficient documentation

## 2013-11-20 DIAGNOSIS — S6000XA Contusion of unspecified finger without damage to nail, initial encounter: Secondary | ICD-10-CM | POA: Insufficient documentation

## 2013-11-20 DIAGNOSIS — W219XXA Striking against or struck by unspecified sports equipment, initial encounter: Secondary | ICD-10-CM | POA: Insufficient documentation

## 2013-11-20 DIAGNOSIS — IMO0002 Reserved for concepts with insufficient information to code with codable children: Secondary | ICD-10-CM

## 2013-11-20 DIAGNOSIS — Y9367 Activity, basketball: Secondary | ICD-10-CM | POA: Insufficient documentation

## 2013-11-20 DIAGNOSIS — Z8709 Personal history of other diseases of the respiratory system: Secondary | ICD-10-CM | POA: Insufficient documentation

## 2013-11-20 DIAGNOSIS — Z791 Long term (current) use of non-steroidal anti-inflammatories (NSAID): Secondary | ICD-10-CM | POA: Insufficient documentation

## 2013-11-20 DIAGNOSIS — T148XXA Other injury of unspecified body region, initial encounter: Secondary | ICD-10-CM

## 2013-11-20 DIAGNOSIS — Z87828 Personal history of other (healed) physical injury and trauma: Secondary | ICD-10-CM | POA: Insufficient documentation

## 2013-11-20 DIAGNOSIS — Z79899 Other long term (current) drug therapy: Secondary | ICD-10-CM | POA: Insufficient documentation

## 2013-11-20 DIAGNOSIS — Y92838 Other recreation area as the place of occurrence of the external cause: Secondary | ICD-10-CM

## 2013-11-20 DIAGNOSIS — S63639A Sprain of interphalangeal joint of unspecified finger, initial encounter: Secondary | ICD-10-CM | POA: Insufficient documentation

## 2013-11-20 MED ORDER — IBUPROFEN 800 MG PO TABS
800.0000 mg | ORAL_TABLET | Freq: Once | ORAL | Status: AC
  Administered 2013-11-20: 800 mg via ORAL
  Filled 2013-11-20: qty 1

## 2013-11-20 MED ORDER — IBUPROFEN 800 MG PO TABS: 800.0000 mg | ORAL_TABLET | Freq: Three times a day (TID) | ORAL | Status: DC

## 2013-11-20 NOTE — Discharge Instructions (Signed)
Contusion  A contusion is a deep bruise. Contusions happen when an injury causes bleeding under the skin. Signs of bruising include pain, puffiness (swelling), and discolored skin. The contusion may turn blue, purple, or yellow.  HOME CARE   · Put ice on the injured area.  · Put ice in a plastic bag.  · Place a towel between your skin and the bag.  · Leave the ice on for 15-20 minutes, 03-04 times a day.  · Only take medicine as told by your doctor.  · Rest the injured area.  · If possible, raise (elevate) the injured area to lessen puffiness.  GET HELP RIGHT AWAY IF:   · You have more bruising or puffiness.  · You have pain that is getting worse.  · Your puffiness or pain is not helped by medicine.  MAKE SURE YOU:   · Understand these instructions.  · Will watch your condition.  · Will get help right away if you are not doing well or get worse.  Document Released: 11/13/2007 Document Revised: 08/19/2011 Document Reviewed: 04/01/2011  ExitCare® Patient Information ©2014 [employer].

## 2013-11-20 NOTE — ED Provider Notes (Addendum)
CSN: [id number]     Arrival date & time 11/20/13  0255 History   First MD Initiated Contact with Patient 11/20/13 0516     Chief Complaint  Patient presents with  . Hand Injury     (Consider location/radiation/quality/duration/timing/severity/associated sxs/prior Treatment) Patient is a [age] y.o. male presenting with hand injury. The history is provided by the patient.  Hand Injury Location:  Hand Time since incident:  8 days Injury: yes   Mechanism of injury comment:  Struck fifth knuckle on basketball 8 days ago.  States the tip of that finger has been crocked from old injury years ago Hand location:  R hand Pain details:    Quality:  Aching   Radiates to:  Does not radiate   Severity:  Moderate   Duration:  8 days   Timing:  Constant   Progression:  Unchanged Chronicity:  New Dislocation: no   Relieved by:  Nothing Worsened by:  Nothing tried Ineffective treatments:  None tried Associated symptoms: no back pain     Past Medical History  Diagnosis Date  . Pneumothorax   . Concussion    History reviewed. No pertinent past surgical history. History reviewed. No pertinent family history. History  Substance Use Topics  . Smoking status: Never Smoker   . Smokeless tobacco: Never Used  . Alcohol Use: No    Review of Systems  Musculoskeletal: Negative for back pain.  All other systems reviewed and are negative.     Allergies  Pheniramine-pe-apap  Home Medications   Prior to Admission medications   Medication Sig Start Date End Date Taking? Authorizing Provider  cetirizine (ZYRTEC) 10 MG tablet Take 1 tablet (10 mg total) by mouth at bedtime as needed for allergies. 09/22/13   Carlisle BeersJohn L Molpus, MD  HYDROcodone-acetaminophen (HYCET) 7.5-325 mg/15 ml solution Take 10 mLs by mouth every 4 (four) hours as needed for pain. 01/04/13   Carlisle BeersJohn L Molpus, MD  ibuprofen (ADVIL,MOTRIN) 800 MG tablet Take 1 tablet (800 mg total) by mouth 3 (three) times daily. 11/20/13   Sherrol Vicars K  Hibo Blasdell-Rasch, MD   BP 107/60  Pulse 62  Temp(Src) 98.1 F (36.7 C) (Oral)  Resp 16  Ht 5\' 10"  (1.778 m)  Wt 170 lb (77.111 kg)  BMI 24.39 kg/m2  SpO2 98% Physical Exam  Constitutional: He is oriented to person, place, and time. He appears well-developed and well-nourished. No distress.  HENT:  Head: Normocephalic and atraumatic.  Mouth/Throat: No oropharyngeal exudate.  Eyes: Conjunctivae are normal. Pupils are equal, round, and reactive to light.  Neck: Normal range of motion. Neck supple.  Cardiovascular: Normal rate, regular rhythm and intact distal pulses.   Pulmonary/Chest: Effort normal and breath sounds normal. He has no wheezes. He has no rales.  Abdominal: Soft. Bowel sounds are normal. There is no tenderness.  Musculoskeletal: Normal range of motion. He exhibits no edema.  Tenderness of the left fifth knuckle distal pinky is twisted and patient reports this is old. NVI left hand cap refill < 2 sec no snuff box tenderness FROM of the LUE  Neurological: He is alert and oriented to person, place, and time. He has normal reflexes.  Skin: Skin is warm and dry.  Psychiatric: He has a normal mood and affect.    ED Course  Procedures (including critical care time) Labs Review Labs Reviewed - No data to display  Imaging Review Dg Hand Complete Left  11/20/2013   CLINICAL DATA:  Injury to left hand, with left fifth  finger pain.  EXAM: LEFT HAND - COMPLETE 3+ VIEW  COMPARISON:  Left hand radiographs performed 08/11/2011  FINDINGS: The somewhat unusual flexed appearance of the fifth proximal interphalangeal joint may reflect underlying tendon injury or possibly mild underlying subluxation. No additional soft tissue abnormalities are seen. There is no definite evidence of fracture.  Visualized joint spaces are preserved. The carpal rows appear grossly intact, and demonstrate normal alignment.  IMPRESSION: Somewhat unusual flexed appearance of the fifth proximal interphalangeal joint  may reflect underlying tendon injury or possibly mild underlying subluxation. No evidence of fracture or dislocation.   Electronically Signed   By: Roanna RaiderJeffery  Chang M.D.   On: 11/20/2013 04:09     EKG Interpretation None      MDM   Final diagnoses:  Contusion  Tendon injury    Contusion and old tendon injury.  NSAIDs ice and elevation and splint.  Follow up with hand surgery regarding old tendon injury    Kennethia Lynes K Abril Cappiello-Rasch, MD 11/20/13 0549  Nalleli Largent K Jayleene Glaeser-Rasch, MD 11/21/13 [id number]

## 2013-11-20 NOTE — ED Notes (Signed)
inj to left hand playing basketball 2-3 days ago  Diff moving little finger

## 2013-11-20 NOTE — ED Notes (Signed)
inj ot left hand playing basketball,  Diff moving little fonger left hand

## 2013-11-20 NOTE — ED Notes (Signed)
Pt sleeping. 

## 2014-05-25 ENCOUNTER — Ambulatory Visit (INDEPENDENT_AMBULATORY_CARE_PROVIDER_SITE_OTHER): Payer: Self-pay | Admitting: Physician Assistant

## 2014-05-25 VITALS — BP 90/60 | HR 54 | Temp 98.1°F | Resp 16 | Ht 71.5 in | Wt 164.8 lb

## 2014-05-25 DIAGNOSIS — Z0000 Encounter for general adult medical examination without abnormal findings: Secondary | ICD-10-CM

## 2014-05-25 NOTE — Patient Instructions (Signed)
Our office will contact you to schedule a complete physical with Dr. Neva SeatGreene at your convenience.
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
## 2014-05-30 NOTE — Progress Notes (Signed)
LMVM advising patient that an appt for a cpe with Dr. Neva SeatGreene was scheduled for 10/03/2014 @ 1:30.

## 2014-09-14 ENCOUNTER — Encounter (HOSPITAL_COMMUNITY): Payer: Self-pay | Admitting: *Deleted

## 2014-09-14 ENCOUNTER — Emergency Department (HOSPITAL_COMMUNITY)
Admission: EM | Admit: 2014-09-14 | Discharge: 2014-09-14 | Disposition: A | Discharge status: Home / Self Care | Payer: BLUE CROSS/BLUE SHIELD | Attending: Emergency Medicine | Admitting: Emergency Medicine

## 2014-09-14 DIAGNOSIS — J029 Acute pharyngitis, unspecified: Secondary | ICD-10-CM

## 2014-09-14 DIAGNOSIS — Z8781 Personal history of (healed) traumatic fracture: Secondary | ICD-10-CM | POA: Diagnosis not present

## 2014-09-14 DIAGNOSIS — Z87828 Personal history of other (healed) physical injury and trauma: Secondary | ICD-10-CM | POA: Insufficient documentation

## 2014-09-14 DIAGNOSIS — Z8659 Personal history of other mental and behavioral disorders: Secondary | ICD-10-CM | POA: Diagnosis not present

## 2014-09-14 LAB — RAPID STREP SCREEN (MED CTR MEBANE ONLY): STREPTOCOCCUS, GROUP A SCREEN (DIRECT): NEGATIVE

## 2014-09-14 MED ORDER — IBUPROFEN 600 MG PO TABS: 600.0000 mg | ORAL_TABLET | Freq: Four times a day (QID) | ORAL | Status: DC | PRN

## 2014-09-14 NOTE — Discharge Instructions (Signed)
Pharyngitis °Pharyngitis is redness, pain, and swelling (inflammation) of your pharynx.  °CAUSES  °Pharyngitis is usually caused by infection. Most of the time, these infections are from viruses (viral) and are part of a cold. However, sometimes pharyngitis is caused by bacteria (bacterial). Pharyngitis can also be caused by allergies. Viral pharyngitis may be spread from person to person by coughing, sneezing, and personal items or utensils (cups, forks, spoons, toothbrushes). Bacterial pharyngitis may be spread from person to person by more intimate contact, such as kissing.  °SIGNS AND SYMPTOMS  °Symptoms of pharyngitis include:   °· Sore throat.   °· Tiredness (fatigue).   °· Low-grade fever.   °· Headache. °· Joint pain and muscle aches. °· Skin rashes. °· Swollen lymph nodes. °· Plaque-like film on throat or tonsils (often seen with bacterial pharyngitis). °DIAGNOSIS  °Your health care provider will ask you questions about your illness and your symptoms. Your medical history, along with a physical exam, is often all that is needed to diagnose pharyngitis. Sometimes, a rapid strep test is done. Other lab tests may also be done, depending on the suspected cause.  °TREATMENT  °Viral pharyngitis will usually get better in 3-4 days without the use of medicine. Bacterial pharyngitis is treated with medicines that kill germs (antibiotics).  °HOME CARE INSTRUCTIONS  °· Drink enough water and fluids to keep your urine clear or pale yellow.   °· Only take over-the-counter or prescription medicines as directed by your health care provider:   °¨ If you are prescribed antibiotics, make sure you finish them even if you start to feel better.   °¨ Do not take aspirin.   °· Get lots of rest.   °· Gargle with 8 oz of salt water (½ tsp of salt per 1 qt of water) as often as every 1-2 hours to soothe your throat.   °· Throat lozenges (if you are not at risk for choking) or sprays may be used to soothe your throat. °SEEK MEDICAL  CARE IF:  °· You have large, tender lumps in your neck. °· You have a rash. °· You cough up green, yellow-brown, or bloody spit. °SEEK IMMEDIATE MEDICAL CARE IF:  °· Your neck becomes stiff. °· You drool or are unable to swallow liquids. °· You vomit or are unable to keep medicines or liquids down. °· You have severe pain that does not go away with the use of recommended medicines. °· You have trouble breathing (not caused by a stuffy nose). °MAKE SURE YOU:  °· Understand these instructions. °· Will watch your condition. °· Will get help right away if you are not doing well or get worse. °Document Released: 05/27/2005 Document Revised: 03/17/2013 Document Reviewed: 02/01/2013 °ExitCare® Patient Information ©2015 [employer]. This information is not intended to replace advice given to you by your health care provider. Make sure you discuss any questions you have with your health care provider. ° ° °Emergency Department Resource Guide °1) Find a Doctor and Pay Out of Pocket °Although you won't have to find out who is covered by your insurance plan, it is a good idea to ask around and get recommendations. You will then need to call the office and see if the doctor you have chosen will accept you as a new patient and what types of options they offer for patients who are self-pay. Some doctors offer discounts or will set up payment plans for their patients who do not have insurance, but you will need to ask so you aren't surprised when you get to your appointment. ° °  2) Contact Your Local Health Department °Not all health departments have doctors that can see patients for sick visits, but many do, so it is worth a call to see if yours does. If you don't know where your local health department is, you can check in your phone book. The CDC also has a tool to help you locate your state's health department, and many state websites also have listings of all of their local health departments. ° °3) Find a Walk-in Clinic °If  your illness is not likely to be very severe or complicated, you may want to try a walk in clinic. These are popping up all over the country in pharmacies, drugstores, and shopping centers. They're usually staffed by nurse practitioners or physician assistants that have been trained to treat common illnesses and complaints. They're usually fairly quick and inexpensive. However, if you have serious medical issues or chronic medical problems, these are probably not your best option. ° °No Primary Care Doctor: °- Call Health Connect at  832-8000 - they can help you locate a primary care doctor that  accepts your insurance, provides certain services, etc. °- Physician Referral Service- 1-800-533-3463 ° °Chronic Pain Problems: °Organization         Address  Phone   Notes  °[facility]  (336) 297-2271 Patients need to be referred by their primary care doctor.  ° °Medication Assistance: °Organization         Address  Phone   Notes  °[place] [facility] [address] °[place], [place] [address] (336) 641-8030 --Must be a resident of [place] °-- Must have NO insurance coverage whatsoever (no Medicaid/ Medicare, etc.) °-- The pt. MUST have a primary care doctor that directs their care regularly and follows them in the community °  °[facility]  (866) 331-1348   °[employer]  (888) 892-1162   ° °Agencies that provide inexpensive medical care: °Organization         Address  Phone   Notes  °[facility]  (336) 832-8035   °[facility]    (336) 832-7272   °[facility] [address] °[place], [place] [address] (336) 832-4777   °[facility] [address], °[place] (336) 271-4999   °[facility]    (336) 373-0678   °[facility]    (336) 272-1050   °[facility] ° [address], [place] Phone:  (336) 832-4444, Fax:  (336) 832-4440 Hours of  Operation:  9 am - 6 pm, M-F.  Also accepts Medicaid/Medicare and self-pay.  ° [facility] ° [address], [place] Phone: (336) 832-3150, Fax: (336) [phone]. Hours of Operation:  8:30 am - 5:30 pm, M-F.  Also accepts Medicaid and self-pay.  °[facility] [place] [address], [place] Phone: (336) 878-6027   °[facility] [address], [place], [place] (336)723-1848, Ext. 123 Mondays & Thursdays: 7-9 AM.  First 15 patients are seen on a first come, first serve basis. °  ° °Medicaid-accepting [place] Providers: ° °Organization         Address  Phone   Notes  °[facility] [address], [place] (336) 641-2100 Also accepts self-pay patients.  °[facility] [address],  ° (336) 856-9996   °[facility] [address], [place] (336) 288-8857   °[facility] [address] [place] [address], [place] (336) 299-7000   °Veita Bland [address], [place]  ° (336) 373-1557 Only accepts [place] Access Medicaid patients after they have their name applied to their card.  ° °Self-Pay (no insurance) in [place]: ° °Organization         Address  Phone   Notes  °Sickle Cell Patients, [facility] [address], [place] (336) 832-1970   °[facility] [address], [place] (336) 832-4400   °[facility] ° [address], [place] (336) 992-4800   °[facility]/Dr. Osei-Bonsu ° [address] [place] [address], [place] or [address], [place] (336) 841-8500 Phone number for both [place] and [place] locations is the same.  °[facility] [address], [place] (336) 299-0000   °[facility] [address] [place] [address], [place] or [address] (336) 852-7530 °(336) 878-2260   °[facility] [address], [place] (336) 350-1642, phone; (336) 294-5005, fax Sees patients 1st and 3rd Saturday of every month.  Must not qualify for public or private insurance (i.e. Medicaid, Medicare, [place] Health Choice, Veterans' Benefits) • Household income should be no more than 200% of the poverty level •The clinic cannot treat you if you are pregnant or think you are pregnant • Sexually transmitted diseases are not treated at the clinic.  ° ° °Dental Care: °Organization         Address  Phone  Notes  °[facility] [address], [place] (336) 641-6152 Accepts children up to age 21 who are enrolled in Medicaid or [place] Health Choice; pregnant women with a Medicaid card; and children who have applied for Medicaid or [place] Health Choice, but were declined, whose parents can pay a reduced fee at time of service.  °[facility] [place]  [address], [place] (336) 641-7733 Accepts children up to age 21 who are enrolled in Medicaid or [place] Health Choice; pregnant women with a Medicaid card; and children who have applied for Medicaid or [place] Health Choice, but were declined, whose parents can pay a reduced fee at time of service.  °[facility] ° [address], [place] (336) 641-4533 Patients are seen by appointment only. Walk-ins are not accepted. [facility] will see patients 18 years of age and older. °Monday - Tuesday (8am-5pm) °Most Wednesdays (8:30-5pm) °$30 per visit, cash only  °[facility] ° [address], [place] (336) 641-4533 Patients are seen by appointment only. Walk-ins are not accepted. [facility] will see patients 18 years of age and older. °One Wednesday Evening (Monthly: Volunteer Based).  $30 per visit, cash only  °[facility]  (919) 537-3737 for adults; Children under age 4, call Graduate Pediatric  Dentistry at (919) [phone]. Children aged 4-14, please call (919) [phone] to request a pediatric application. ° Dental services are provided in all areas of dental care including fillings, crowns and bridges, complete and partial dentures, implants, gum treatment, root canals, and extractions. Preventive care is also provided. Treatment is provided to both adults and children. °Patients are selected via a lottery and there is often a waiting list. °  °[facility] [address], °  [place] ° (336) 763-8833 [email or web address] °  °[facility] [address], [place], [place] (336)723-1848, Ext. 123 Second and Fourth Thursday of each month, opens at 6:30 AM; Clinic ends at 9 AM.  Patients are seen on a first-come first-served basis, and a limited number are seen during each clinic.  ° °[facility] ° [address], [place], [place] (336) 723-7904   Eligibility Requirements °You must have lived in [place], or [place] counties for at least the last three months. °  You cannot be eligible for state or federal sponsored healthcare insurance, including Veterans Administration, Medicaid, or Medicare. °  You generally cannot be eligible for healthcare insurance through your employer.  °  How to apply: °Eligibility screenings are held every Tuesday and Wednesday afternoon from 1:00 pm until 4:00 pm. You do not need an appointment for the interview!  °[facility] [address], [place], [place] 336-631-2330   °[facility]  336-342-8273   °[facility]  336-703-3100   °[facility]  336-570-6415   ° °Behavioral Health Resources in the Community: °Intensive Outpatient Programs °Organization         Address  Phone  Notes  °[facility] [address], [place], [place] 336-878-6098   °[facility] [address], [place], [place] 336-832-9800   °[facility] [address], [place], [place] ° 336-882-2125   °[facility] [address],  °[place], [place] 1-800-853-5163 or 336-641-4981   °Substance Abuse Resources °Organization         Address  Phone  Notes  °[facility]  336-882-2125   °[facility]  336-784-9470   °[facility]  336-285-9073   °[facility]  336-845-3988   °Residential & Outpatient Substance Abuse Program  1-800-659-3381   °Psychological Services °Organization         Address  Phone  Notes  °[facility]  336- 832-9600   °[facility]  336- 378-7881   °[facility] [address], [place] 1-800-853-5163 or 336-641-4981   ° °Mobile Crisis Teams °Organization         Address  Phone  Notes  °[facility], Mobile Crisis Care Unit  1-877-626-1772   °Assertive °[facility] ° [address] [place], [place] 336-834-9664   °Sharon DeEsch [address] °[place] 336-554-5454   ° °Self-Help/Support Groups °Organization         Address  Phone             Notes  °[facility] - variety of support groups  336- 373-1402 Call for more information  °Narcotics Anonymous (NA), [facility] [address], °[place]  2 meetings at this location  ° °Residential Treatment Programs °Organization         Address  Phone  Notes  °[facility] [address],    °[place]  1-866-801-8205   °[facility] ° [address], [place], [place] 704-293-8524   °[facility] [address], [place] 336-845-3988 Admissions: 8am-3pm M-F  °[facility] [address]    °[place], [place] 336-841-1104   °[facility] [address], [place], [place] 336-379-7146   °[facility] [address]  °[place], [place] 336-285-9073   °[facility] - Intensive Outpatient [address],  [place], [place] 336-852-3033   °[facility] [address]  °[place], [place] 1-877-615-2722 or 336-784-9470   °[facility] [address] [place], [place] 336-227-7417 Accepts Medicaid  °[facility] [address]  °[place] 1-800-659-3381 Substance Abuse/Addiction Treatment  ° °[place] Behavioral Health Resources °Organization         Address  Phone  Notes  °[facility]  (888) 581-9988   °Julie Brannon, PhD [address] [place], [place]   (336) 349-5553 or (336) 951-0000   °[facility]   [address] °[place], [place] (336) 349-4454   °[facility] [address], [place], [place] (336) 342-8316 Insurance/Medicaid/sponsorship through [facility]  °[facility] [address]                                    [place], [place] (336) 342-8316 Therapy/tele-psych/case  °[facility] [address]  ° [place],  (336) 349-2233    °Dr. Arfeen  (336) 349-4544   °[facility] [place]  [employer] [place] [facility] 1) [address], [place] °2) [address], [place] °3)  [address], [place] (336) 349-3220 °(336) 342-7768 ° °(336) 342-8140   °[place] Child Abuse Hotline (336) 342-1394 or (336) 342-3537 (After Hours)    ° ° ° ° °

## 2014-09-14 NOTE — ED Provider Notes (Signed)
CSN: [id number]     Arrival date & time 09/14/14  1135 History  This chart was scribed for non-physician practitioner, Jinny Sanders, PA-C, working with Donnetta Hutching, MD by Charline Bills, ED Scribe. This patient was seen in room TR06C/TR06C and the patient's care was started at 12:44 PM.   Chief Complaint  Patient presents with  . Sore Throat   The history is provided by the patient. No language interpreter was used.   HPI Comments: Jermaine Obrien is a 24 y.o. male who presents to the Emergency Department complaining of persistent sore throat for the past week. He reports nasal congestion, cough, hoarseness that he attributes to environmental allergies. He denies fever, sinus pressure, rhinorrhea, loss of appetite. He has been treating with Zyrtec with mild relief.   Past Medical History  Diagnosis Date  . Pneumothorax   . Concussion   . Closed fracture of rib of right side 04/14/2011  . Concussion syndrome 04/14/2011  . Nasal fracture 04/14/2011  . Pneumothorax, left 04/14/2011   History reviewed. No pertinent past surgical history. History reviewed. No pertinent family history. History  Substance Use Topics  . Smoking status: Never Smoker   . Smokeless tobacco: Never Used  . Alcohol Use: No    Review of Systems  Constitutional: Negative for appetite change.  HENT: Positive for congestion, sore throat and voice change. Negative for rhinorrhea and sinus pressure.   Respiratory: Positive for cough.   Allergic/Immunologic: Positive for environmental allergies.   Allergies  Pheniramine-pe-apap   Home Medications   Prior to Admission medications   Medication Sig Start Date End Date Taking? Authorizing Provider  ibuprofen (ADVIL,MOTRIN) 600 MG tablet Take 1 tablet (600 mg total) by mouth every 6 (six) hours as needed. 09/14/14   Ladona Mow, PA-C   BP 113/71 mmHg  Pulse 70  Temp(Src) 98.1 F (36.7 C) (Oral)  Resp 18  Ht  (1.803 m)  Wt 170 lb (77.111 kg)  BMI 23.72 kg/m2   SpO2 95% Physical Exam  Constitutional: He is oriented to person, place, and time. He appears well-developed and well-nourished. No distress.  HENT:  Head: Normocephalic and atraumatic.  Right Ear: Tympanic membrane normal.  Left Ear: Tympanic membrane normal.  Mouth/Throat: Posterior oropharyngeal erythema present. No posterior oropharyngeal edema or tonsillar abscesses.  Nasal turbinates are erythematous with clear discharge bilaterally.   Eyes: Conjunctivae and EOM are normal.  Neck: Neck supple. No tracheal deviation present.  Cardiovascular: Normal rate and regular rhythm.   Pulmonary/Chest: Effort normal and breath sounds normal. No respiratory distress.  Musculoskeletal: Normal range of motion.  Lymphadenopathy:    He has no cervical adenopathy.  Neurological: He is alert and oriented to person, place, and time.  Skin: Skin is warm and dry.  Psychiatric: He has a normal mood and affect. His behavior is normal.  Nursing note and vitals reviewed.  ED Course  Procedures (including critical care time) DIAGNOSTIC STUDIES: Oxygen Saturation is 95% on RA, normal by my interpretation.    COORDINATION OF CARE: 12:52 PM-Discussed treatment plan which includes strep screen, continue Zyrtec, Tylenol and Motrin with pt at bedside and pt agreed to plan.   Labs Review Labs Reviewed  RAPID STREP SCREEN  CULTURE, GROUP A STREP   Imaging Review No results found.   EKG Interpretation None      MDM   Final diagnoses:  Viral pharyngitis   Patient signed symptoms consistent with a viral pharyngitis. Strep negative. No tonsillar exudate. Patient does not  meet [facility] criteria for empiric therapy for a possible bacterial pharyngitis. Throat culture sent. No concern for PTA or retropharyngeal abscess. Patient tolerating by mouth fluids well.  Discussed over-the-counter and symptomatic therapy with patient, and encouraged patient to follow up with a primary care provider, patient  verbalizes understanding and agreement of this plan. I encouraged patient to call or return to ER with any worsening of symptoms or should he have any questions or concerns.  I personally performed the services described in this documentation, which was scribed in my presence. The recorded information has been reviewed and is accurate.  BP 110/53 mmHg  Pulse 83  Temp(Src) 97.5 F (36.4 C) (Oral)  Resp 16  Ht 5\' 11"  (1.803 m)  Wt 170 lb (77.111 kg)  BMI 23.72 kg/m2  SpO2 98%  Signed,  Ladona MowJoe Weslee Prestage, PA-C 5:48 PM    Ladona MowJoe Tayson Schnelle, PA-C 09/14/14 1748  Donnetta HutchingBrian Cook, MD 09/15/14 [phone]

## 2014-09-14 NOTE — ED Notes (Signed)
Pt reports having intermittent hoarseness and sore throat x 1 week. Denies difficulty swallowing or fever. Airway intact.

## 2014-09-14 NOTE — ED Notes (Signed)
Pt here for c/o sore throat, nasal congestion, cough, weakness-x 1 week. Denies N/V.

## 2014-09-16 LAB — CULTURE, GROUP A STREP: STREP A CULTURE: NEGATIVE

## 2014-10-03 ENCOUNTER — Encounter: Payer: BC Managed Care – PPO | Admitting: Family Medicine

## 2014-10-05 NOTE — Progress Notes (Signed)
This encounter was created in error - please disregard.

## 2016-10-26 ENCOUNTER — Ambulatory Visit: Payer: Self-pay | Admitting: Physician Assistant

## 2016-10-28 ENCOUNTER — Ambulatory Visit (INDEPENDENT_AMBULATORY_CARE_PROVIDER_SITE_OTHER): Payer: Self-pay | Admitting: Emergency Medicine

## 2016-10-28 ENCOUNTER — Encounter: Payer: Self-pay | Admitting: Emergency Medicine

## 2016-10-28 VITALS — BP 109/72 | HR 63 | Temp 98.5°F | Resp 16 | Ht 70.0 in | Wt 175.2 lb

## 2016-10-28 DIAGNOSIS — Z0000 Encounter for general adult medical examination without abnormal findings: Secondary | ICD-10-CM

## 2016-10-28 NOTE — Progress Notes (Signed)
Jermaine Obrien 26 y.o.   Chief Complaint  Patient presents with  . DMV px paperwork    HISTORY OF PRESENT ILLNESS: This is a 26 y.o. male here to have evaluation for fitness to drive; has DMV form to be filled out. No medical issues or concerns.  HPI   Prior to Admission medications   Medication Sig Start Date End Date Taking? Authorizing Provider  ibuprofen (ADVIL,MOTRIN) 600 MG tablet Take 1 tablet (600 mg total) by mouth every 6 (six) hours as needed. 09/14/14  Yes Ladona Mow, PA-C    Allergies  Allergen Reactions  . Pheniramine-Pe-Apap Hives and Other (See Comments)    Pt states allergy to only the wild berry flavor of theraflu    There are no active problems to display for this patient.   Past Medical History:  Diagnosis Date  . Closed fracture of rib of right side 04/14/2011  . Concussion   . Concussion syndrome 04/14/2011  . Nasal fracture 04/14/2011  . Pneumothorax   . Pneumothorax, left 04/14/2011    No past surgical history on file.  Social History   Social History  . Marital status: Single    Spouse name: N/A  . Number of children: 1  . Years of education: 12th grade   Occupational History  . Warden/ranger  . delivery driver     [employer]   Social History Main Topics  . Smoking status: Never Smoker  . Smokeless tobacco: Never Used  . Alcohol use No  . Drug use: No  . Sexual activity: Not on file   Other Topics Concern  . Not on file   Social History Narrative   Lives alone.   He shares custody of his son.    No family history on file.   Review of Systems  Constitutional: Negative.   HENT: Negative.   Eyes: Negative.   Respiratory: Negative.   Cardiovascular: Negative.   Gastrointestinal: Negative.   Genitourinary: Negative.   Musculoskeletal: Negative.   Skin: Negative.   Neurological: Negative.   Endo/Heme/Allergies: Negative.   Psychiatric/Behavioral: Negative.   All other systems reviewed and are negative.  Vitals:    10/28/16 0900  BP: 109/72  Pulse: 63  Resp: 16  Temp: 98.5 F (36.9 C)     Physical Exam  Constitutional: He is oriented to person, place, and time. He appears well-developed and well-nourished.  HENT:  Head: Normocephalic and atraumatic.  Nose: Nose normal.  Mouth/Throat: Oropharynx is clear and moist. No oropharyngeal exudate.  Eyes: Conjunctivae and EOM are normal. Pupils are equal, round, and reactive to light.  Neck: Normal range of motion. Neck supple. No JVD present. No thyromegaly present.  Cardiovascular: Normal rate, regular rhythm, normal heart sounds and intact distal pulses.   Pulmonary/Chest: Effort normal and breath sounds normal.  Abdominal: Soft. Bowel sounds are normal.  Musculoskeletal: Normal range of motion.  Lymphadenopathy:    He has no cervical adenopathy.  Neurological: He is alert and oriented to person, place, and time. He displays normal reflexes. No cranial nerve deficit or sensory deficit. He exhibits normal muscle tone. Coordination normal.  Skin: Skin is warm and dry. Capillary refill takes less than 2 seconds. No rash noted.  Psychiatric: He has a normal mood and affect. His behavior is normal.  Vitals reviewed.    ASSESSMENT & PLAN: [name] was seen today for dmv px paperwork.  Diagnoses and all orders for this visit:  Routine general medical examination  at a health care facility   Form filled out. Pt fit to drive.   Edwina BarthMiguel Haelie Clapp, MD [facility]

## 2016-10-28 NOTE — Patient Instructions (Signed)
     IF you received an x-ray today, you will receive an invoice from [facility]. Please contact [facility] at [phone] with questions or concerns regarding your invoice.   IF you received labwork today, you will receive an invoice from [facility]. Please contact [facility] at [phone] with questions or concerns regarding your invoice.   Our billing staff will not be able to assist you with questions regarding bills from these companies.  You will be contacted with the lab results as soon as they are available. The fastest way to get your results is to activate your My Chart account. Instructions are located on the last page of this paperwork. If you have not heard from us regarding the results in 2 weeks, please contact this office.     

## 2018-09-08 ENCOUNTER — Other Ambulatory Visit: Payer: Self-pay

## 2018-09-08 ENCOUNTER — Emergency Department (HOSPITAL_COMMUNITY)
Admission: EM | Admit: 2018-09-08 | Discharge: 2018-09-08 | Disposition: A | Discharge status: Home / Self Care | Payer: BLUE CROSS/BLUE SHIELD | Attending: Emergency Medicine | Admitting: Emergency Medicine

## 2018-09-08 ENCOUNTER — Encounter (HOSPITAL_COMMUNITY): Payer: Self-pay | Admitting: Emergency Medicine

## 2018-09-08 DIAGNOSIS — R059 Cough, unspecified: Secondary | ICD-10-CM

## 2018-09-08 DIAGNOSIS — Z79899 Other long term (current) drug therapy: Secondary | ICD-10-CM | POA: Insufficient documentation

## 2018-09-08 DIAGNOSIS — R05 Cough: Secondary | ICD-10-CM | POA: Insufficient documentation

## 2018-09-08 MED ORDER — ALBUTEROL SULFATE HFA 108 (90 BASE) MCG/ACT IN AERS
2.0000 | INHALATION_SPRAY | RESPIRATORY_TRACT | Status: DC
  Administered 2018-09-08: 2 via RESPIRATORY_TRACT
  Filled 2018-09-08: qty 6.7

## 2018-09-08 MED ORDER — AEROCHAMBER PLUS FLO-VU LARGE MISC
Status: AC
  Filled 2018-09-08: qty 1

## 2018-09-08 MED ORDER — AEROCHAMBER PLUS FLO-VU MEDIUM MISC
1.0000 | Freq: Once | Status: AC
  Administered 2018-09-08: 1
  Filled 2018-09-08: qty 1

## 2018-09-08 MED ORDER — ALBUTEROL SULFATE HFA 108 (90 BASE) MCG/ACT IN AERS: 2.0000 | INHALATION_SPRAY | RESPIRATORY_TRACT | 1 refills | Status: DC | PRN

## 2018-09-08 NOTE — ED Provider Notes (Signed)
[facility] EMERGENCY DEPARTMENT Provider Note   CSN: [id number] Arrival date & time: 09/08/18  1844    History   Chief Complaint Chief Complaint  Patient presents with  . Cough    HPI Jermaine Obrien is a 28 y.o. male with no significant past medical history other than seasonal allergies who presents today for evaluation of "feeling like my respiratory system is not working well."  He says that he started coughing last night and today has not felt short of breath however he feels like he cannot breathe as deeply as he usually can.  He denies any fevers.  He does not have any known contacts with anyone with coronavirus however he works at the [employer].  He denies any nasal congestion.  He reports that he takes medicines for seasonal allergies, and that he has not had any significant worsening of his nasal congestion or postnasal drainage.  He denies any chest pain.  No abdominal pain, nausea, vomiting, or diarrhea.     HPI  Past Medical History:  Diagnosis Date  . Closed fracture of rib of right side 04/14/2011  . Concussion   . Concussion syndrome 04/14/2011  . Nasal fracture 04/14/2011  . Pneumothorax   . Pneumothorax, left 04/14/2011    Patient Active Problem List   Diagnosis Date Noted  . Routine general medical examination at a health care facility 10/28/2016    History reviewed. No pertinent surgical history.      Home Medications    Prior to Admission medications   Medication Sig Start Date End Date Taking? Authorizing Provider  albuterol (PROVENTIL HFA;VENTOLIN HFA) 108 (90 Base) MCG/ACT inhaler Inhale 2 puffs into the lungs every 4 (four) hours as needed for wheezing or shortness of breath. 09/08/18   Cristina Gong, PA-C  ibuprofen (ADVIL,MOTRIN) 600 MG tablet Take 1 tablet (600 mg total) by mouth every 6 (six) hours as needed. 09/14/14   Ladona Mow, PA-C    Family History No family history on file.  Social History Social History    Tobacco Use  . Smoking status: Never Smoker  . Smokeless tobacco: Never Used  Substance Use Topics  . Alcohol use: No  . Drug use: No     Allergies   Pheniramine-pe-apap   Review of Systems Review of Systems  Constitutional: Negative for chills and fever.  Respiratory: Positive for cough.        Feels like he cannot take a full deep breath however clarifies that he is not short of breath.  Cardiovascular: Negative for chest pain, palpitations and leg swelling.  Gastrointestinal: Negative for abdominal pain, diarrhea, nausea and vomiting.  Musculoskeletal: Negative for neck pain.  All other systems reviewed and are negative.    Physical Exam Updated Vital Signs BP 118/85 (BP Location: Right Arm)   Pulse 64   Temp 98 F (36.7 C) (Oral)   Resp 16   SpO2 97%   Physical Exam Vitals signs and nursing note reviewed.  Constitutional:      General: He is not in acute distress.    Appearance: He is not toxic-appearing.  HENT:     Head: Normocephalic and atraumatic.     Mouth/Throat:     Mouth: Mucous membranes are moist.     Pharynx: No oropharyngeal exudate or posterior oropharyngeal erythema.  Eyes:     Conjunctiva/sclera: Conjunctivae normal.  Neck:     Musculoskeletal: Normal range of motion and neck supple. No neck rigidity.  Cardiovascular:  Rate and Rhythm: Normal rate.     Pulses: Normal pulses.     Heart sounds: Normal heart sounds.  Pulmonary:     Effort: Pulmonary effort is normal. No respiratory distress.     Breath sounds: Normal breath sounds. No stridor. No rhonchi or rales.  Abdominal:     General: Abdomen is flat.  Skin:    General: Skin is warm and dry.  Neurological:     General: No focal deficit present.     Mental Status: He is alert.  Psychiatric:        Mood and Affect: Mood normal.        Behavior: Behavior normal.      ED Treatments / Results  Labs (all labs ordered are listed, but only abnormal results are displayed) Labs  Reviewed - No data to display  EKG None  Radiology No results found.  Procedures Procedures (including critical care time)  Medications Ordered in ED Medications  AeroChamber Plus Flo-Vu Medium MISC 1 each (has no administration in time range)  albuterol (PROVENTIL HFA;VENTOLIN HFA) 108 (90 Base) MCG/ACT inhaler 2 puff (has no administration in time range)     Initial Impression / Assessment and Plan / ED Course  I have reviewed the triage vital signs and the nursing notes.  Pertinent labs & imaging results that were available during my care of the patient were reviewed by me and considered in my medical decision making (see chart for details).  Clinical Course as of Sep 08 1954  Tue Sep 08, 2018  1948 Spoke with ED pharmacist about allergy contraindication between patient's allergy of the wild berry flavor of TheraFlu and albuterol.  They stated that they are not aware of any contraindication with patient's allergy that would keep him from being able to get albuterol safely.   [EH]    Clinical Course User Index [EH] Cristina Gong, PA-C       Jermaine Obrien was evaluated in Emergency Department on 09/08/2018 for the symptoms described in the history of present illness. He was evaluated in the context of the global COVID-19 pandemic, which necessitated consideration that the patient might be at risk for infection with the SARS-CoV-2 virus that causes COVID-19. Institutional protocols and algorithms that pertain to the evaluation of patients at risk for COVID-19 are in a state of rapid change based on information released by regulatory bodies including the CDC and federal and state organizations. These policies and algorithms were followed during the patient's care in the ED.  Patient presents today for evaluation of dry cough and feeling like he cannot take is full of breath as he usually is able to.  He is currently treating himself at home for seasonal allergies.  His  symptoms started yesterday.  He does not have any known coronavirus contacts however he works at the post office.  He is generally well-appearing, he is not tachycardic or hypoxic.  He is afebrile and not tachypneic.  His lungs sound clear to auscultation bilaterally, however as he has allergies will give him a albuterol inhaler to see if that provides relief.  Albuterol inhaler and spacing chamber were ordered in the department.  Given that his cough is been present for under 24 hours, and he does not have any recent URI illness history I do not suspect pneumonia at this time.  Does not have any chest pain, no indication for EKG.  Suspect viral process.  Recommended conservative care, work note given.  Return precautions  were discussed with patient who states their understanding.  At the time of discharge patient denied any unaddressed complaints or concerns.  Patient is agreeable for discharge home.   Final Clinical Impressions(s) / ED Diagnoses   Final diagnoses:  Cough    ED Discharge Orders         Ordered    albuterol (PROVENTIL HFA;VENTOLIN HFA) 108 (90 Base) MCG/ACT inhaler  Every 4 hours PRN     09/08/18 1949           Norman Clay 09/08/18 1956    Shaune Pollack, MD 09/09/18 1204

## 2018-09-08 NOTE — ED Triage Notes (Signed)
Pt reports having a cough that started today. Pt worried about covid 19 stating one of his coworkers came in contact with someone who was positive. Pt unsure if his coworker is having any symptoms. Pt endorses some SOB that started today. Pt works at post office.

## 2018-09-08 NOTE — Discharge Instructions (Addendum)
Use your inhaler please take 2 puffs every 4 hours.  If needed you may take 2 puffs every hour, however if you find yourself needing to do this for more than 3 hours please return to the emergency room.  As we discussed today there is a chance that you may have coronavirus.  We are only able to test people who are sick enough to require admission at this time.  If your symptoms worsen, you have concerns, please reference the information I have given you and return to the emergency room if indicated.  Please take Ibuprofen (Advil, motrin) and Tylenol (acetaminophen) to relieve your pain.  You may take up to 600 MG (3 pills) of normal strength ibuprofen every 8 hours as needed.  In between doses of ibuprofen you make take tylenol, up to 1,000 mg (two extra strength pills).  Do not take more than 3,000 mg tylenol in a 24 hour period.  Please check all medication labels as many medications such as pain and cold medications may contain tylenol.  Do not drink alcohol while taking these medications.  Do not take other NSAID'S while taking ibuprofen (such as aleve or naproxen).  Please take ibuprofen with food to decrease stomach upset. Please try using Tylenol, if that does not work to control any aches or pains you may use ibuprofen also as needed.     Person Under Monitoring Name: Jermaine Obrien  Location: [address] [place] [address]   Infection Prevention Recommendations for Individuals Confirmed to have, or Being Evaluated for, 2019 Novel Coronavirus (COVID-19) Infection Who Receive Care at Home  Individuals who are confirmed to have, or are being evaluated for, COVID-19 should follow the prevention steps below until a healthcare provider or local or state health department says they can return to normal activities.  Stay home except to get medical care You should restrict activities outside your home, except for getting medical care. Do not go to work, school, or  public areas, and do not use public transportation or taxis.  Call ahead before visiting your doctor Before your medical appointment, call the healthcare provider and tell them that you have, or are being evaluated for, COVID-19 infection. This will help the healthcare providers office take steps to keep other people from getting infected. Ask your healthcare provider to call the local or state health department.  Monitor your symptoms Seek prompt medical attention if your illness is worsening (e.g., difficulty breathing). Before going to your medical appointment, call the healthcare provider and tell them that you have, or are being evaluated for, COVID-19 infection. Ask your healthcare provider to call the local or state health department.  Wear a facemask You should wear a facemask that covers your nose and mouth when you are in the same room with other people and when you visit a healthcare provider. People who live with or visit you should also wear a facemask while they are in the same room with you.  Separate yourself from other people in your home As much as possible, you should stay in a different room from other people in your home. Also, you should use a separate bathroom, if available.  Avoid sharing household items You should not share dishes, drinking glasses, cups, eating utensils, towels, bedding, or other items with other people in your home. After using these items, you should wash them thoroughly with soap and water.  Cover your coughs and sneezes Cover your mouth and nose with a tissue  when you cough or sneeze, or you can cough or sneeze into your sleeve. Throw used tissues in a lined trash can, and immediately wash your hands with soap and water for at least 20 seconds or use an alcohol-based hand rub.  Wash your [employer] your hands often and thoroughly with soap and water for at least 20 seconds. You can use an alcohol-based hand sanitizer if soap and water  are not available and if your hands are not visibly dirty. Avoid touching your eyes, nose, and mouth with unwashed hands.   Prevention Steps for Caregivers and Household Members of Individuals Confirmed to have, or Being Evaluated for, COVID-19 Infection Being Cared for in the Home  If you live with, or provide care at home for, a person confirmed to have, or being evaluated for, COVID-19 infection please follow these guidelines to prevent infection:  Follow healthcare providers instructions Make sure that you understand and can help the patient follow any healthcare provider instructions for all care.  Provide for the patients basic needs You should help the patient with basic needs in the home and provide support for getting groceries, prescriptions, and other personal needs.  Monitor the patients symptoms If they are getting sicker, call his or her medical provider and tell them that the patient has, or is being evaluated for, COVID-19 infection. This will help the healthcare providers office take steps to keep other people from getting infected. Ask the healthcare provider to call the local or state health department.  Limit the number of people who have contact with the patient If possible, have only one caregiver for the patient. Other household members should stay in another home or place of residence. If this is not possible, they should stay in another room, or be separated from the patient as much as possible. Use a separate bathroom, if available. Restrict visitors who do not have an essential need to be in the home.  Keep older adults, very young children, and other sick people away from the patient Keep older adults, very young children, and those who have compromised immune systems or chronic health conditions away from the patient. This includes people with chronic heart, lung, or kidney conditions, diabetes, and cancer.  Ensure good ventilation Make sure that  shared spaces in the home have good air flow, such as from an air conditioner or an opened window, weather permitting.  Wash your hands often Wash your hands often and thoroughly with soap and water for at least 20 seconds. You can use an alcohol based hand sanitizer if soap and water are not available and if your hands are not visibly dirty. Avoid touching your eyes, nose, and mouth with unwashed hands. Use disposable paper towels to dry your hands. If not available, use dedicated cloth towels and replace them when they become wet.  Wear a facemask and gloves Wear a disposable facemask at all times in the room and gloves when you touch or have contact with the patients blood, body fluids, and/or secretions or excretions, such as sweat, saliva, sputum, nasal mucus, vomit, urine, or feces.  Ensure the mask fits over your nose and mouth tightly, and do not touch it during use. Throw out disposable facemasks and gloves after using them. Do not reuse. Wash your hands immediately after removing your facemask and gloves. If your personal clothing becomes contaminated, carefully remove clothing and launder. Wash your hands after handling contaminated clothing. Place all used disposable facemasks, gloves, and other waste in a  lined container before disposing them with other household waste. Remove gloves and wash your hands immediately after handling these items.  Do not share dishes, glasses, or other household items with the patient Avoid sharing household items. You should not share dishes, drinking glasses, cups, eating utensils, towels, bedding, or other items with a patient who is confirmed to have, or being evaluated for, COVID-19 infection. After the person uses these items, you should wash them thoroughly with soap and water.  Wash laundry thoroughly Immediately remove and wash clothes or bedding that have blood, body fluids, and/or secretions or excretions, such as sweat, saliva, sputum,  nasal mucus, vomit, urine, or feces, on them. Wear gloves when handling laundry from the patient. Read and follow directions on labels of laundry or clothing items and detergent. In general, wash and dry with the warmest temperatures recommended on the label.  Clean all areas the individual has used often Clean all touchable surfaces, such as counters, tabletops, doorknobs, bathroom fixtures, toilets, phones, keyboards, tablets, and bedside tables, every day. Also, clean any surfaces that may have blood, body fluids, and/or secretions or excretions on them. Wear gloves when cleaning surfaces the patient has come in contact with. Use a diluted bleach solution (e.g., dilute bleach with 1 part bleach and 10 parts water) or a household disinfectant with a label that says EPA-registered for coronaviruses. To make a bleach solution at home, add 1 tablespoon of bleach to 1 quart (4 cups) of water. For a larger supply, add  cup of bleach to 1 gallon (16 cups) of water. Read labels of cleaning products and follow recommendations provided on product labels. Labels contain instructions for safe and effective use of the cleaning product including precautions you should take when applying the product, such as wearing gloves or eye protection and making sure you have good ventilation during use of the product. Remove gloves and wash hands immediately after cleaning.  Monitor yourself for signs and symptoms of illness Caregivers and household members are considered close contacts, should monitor their health, and will be asked to limit movement outside of the home to the extent possible. Follow the monitoring steps for close contacts listed on the symptom monitoring form.   ? If you have additional questions, contact your local health department or call the epidemiologist on call at [phone] (available 24/7). ? This guidance is subject to change. For the most up-to-date guidance from [facility], please refer to  their website: [email or web address]

## 2019-10-22 ENCOUNTER — Other Ambulatory Visit: Payer: Self-pay

## 2019-10-22 ENCOUNTER — Emergency Department (HOSPITAL_BASED_OUTPATIENT_CLINIC_OR_DEPARTMENT_OTHER)
Admission: EM | Admit: 2019-10-22 | Discharge: 2019-10-22 | Disposition: A | Discharge status: Home / Self Care | Attending: Emergency Medicine | Admitting: Emergency Medicine

## 2019-10-22 ENCOUNTER — Encounter (HOSPITAL_BASED_OUTPATIENT_CLINIC_OR_DEPARTMENT_OTHER): Payer: Self-pay | Admitting: Emergency Medicine

## 2019-10-22 DIAGNOSIS — Z79899 Other long term (current) drug therapy: Secondary | ICD-10-CM | POA: Insufficient documentation

## 2019-10-22 DIAGNOSIS — B354 Tinea corporis: Secondary | ICD-10-CM | POA: Insufficient documentation

## 2019-10-22 DIAGNOSIS — L089 Local infection of the skin and subcutaneous tissue, unspecified: Secondary | ICD-10-CM | POA: Insufficient documentation

## 2019-10-22 DIAGNOSIS — R21 Rash and other nonspecific skin eruption: Secondary | ICD-10-CM | POA: Diagnosis present

## 2019-10-22 MED ORDER — SULFAMETHOXAZOLE-TRIMETHOPRIM 800-160 MG PO TABS
1.0000 | ORAL_TABLET | Freq: Once | ORAL | Status: AC
  Administered 2019-10-22: 1 via ORAL
  Filled 2019-10-22: qty 1

## 2019-10-22 MED ORDER — IBUPROFEN 800 MG PO TABS
800.0000 mg | ORAL_TABLET | Freq: Once | ORAL | Status: DC
  Filled 2019-10-22: qty 1

## 2019-10-22 MED ORDER — SULFAMETHOXAZOLE-TRIMETHOPRIM 800-160 MG PO TABS: 1.0000 | ORAL_TABLET | Freq: Two times a day (BID) | ORAL | 0 refills | Status: AC

## 2019-10-22 NOTE — ED Triage Notes (Addendum)
Pt reports boil/abscess to anterior right leg. Denies fever, states looked like an ingrown hair a couple days ago.

## 2019-10-22 NOTE — ED Provider Notes (Signed)
[facility] EMERGENCY DEPARTMENT Provider Note   CSN: [id number] Arrival date & time: 10/22/19  0443     History Chief Complaint  Patient presents with  . Rash    Jermaine Obrien is a 29 y.o. male.  The history is provided by the patient.  Rash Location: right thigh anteriorly  Quality: painful and scaling   Quality: not blistering and not draining   Pain details:    Quality:  Aching   Severity:  Moderate   Onset quality:  Gradual   Duration:  2 days   Timing:  Constant   Progression:  Unchanged Severity:  Moderate Onset quality:  Gradual Duration:  2 days Timing:  Constant Progression:  Unchanged Chronicity:  New Context: not animal contact   Context comment:  Thought he had an ingrown hair on the thigh  Relieved by:  Nothing Worsened by:  Nothing Ineffective treatments:  None tried Associated symptoms: induration   Associated symptoms: no abdominal pain, no fever and no shortness of breath   Patient thought he had an ingrown hair now there is a small scab with surrounding redness.  There is also a ring lesion with a collarette of scale.      Past Medical History:  Diagnosis Date  . Closed fracture of rib of right side 04/14/2011  . Concussion   . Concussion syndrome 04/14/2011  . Nasal fracture 04/14/2011  . Pneumothorax   . Pneumothorax, left 04/14/2011    Patient Active Problem List   Diagnosis Date Noted  . Routine general medical examination at a health care facility 10/28/2016    History reviewed. No pertinent surgical history.     History reviewed. No pertinent family history.  Social History   Tobacco Use  . Smoking status: Never Smoker  . Smokeless tobacco: Never Used  Substance Use Topics  . Alcohol use: No  . Drug use: No    Home Medications Prior to Admission medications   Medication Sig Start Date End Date Taking? Authorizing Provider  albuterol (PROVENTIL HFA;VENTOLIN HFA) 108 (90 Base) MCG/ACT inhaler Inhale 2 puffs  into the lungs every 4 (four) hours as needed for wheezing or shortness of breath. 09/08/18   Cristina Gong, PA-C  ibuprofen (ADVIL,MOTRIN) 600 MG tablet Take 1 tablet (600 mg total) by mouth every 6 (six) hours as needed. 09/14/14   Ladona Mow, PA-C  sulfamethoxazole-trimethoprim (BACTRIM DS) 800-160 MG tablet Take 1 tablet by mouth 2 (two) times daily for 7 days. 10/22/19 10/29/19  Eleftheria Taborn, MD    Allergies    Pheniramine-pe-apap  Review of Systems   Review of Systems  Constitutional: Negative for fever.  HENT: Negative for congestion.   Eyes: Negative for visual disturbance.  Respiratory: Negative for shortness of breath.   Cardiovascular: Negative for chest pain.  Gastrointestinal: Negative for abdominal pain.  Genitourinary: Negative for difficulty urinating.  Skin: Positive for color change and rash.  Neurological: Negative for dizziness.  Psychiatric/Behavioral: Negative for agitation.  All other systems reviewed and are negative.   Physical Exam Updated Vital Signs BP 106/85 (BP Location: Right Arm)   Pulse 65   Temp 98.1 F (36.7 C) (Oral)   Resp 16   Ht 5' 10.5" (1.791 m)   Wt 88.5 kg   SpO2 98%   BMI 27.58 kg/m   Physical Exam Vitals and nursing note reviewed.  Constitutional:      General: He is not in acute distress.    Appearance: Normal appearance.  HENT:  Head: Normocephalic and atraumatic.     Nose: Nose normal.  Eyes:     Pupils: Pupils are equal, round, and reactive to light.  Cardiovascular:     Rate and Rhythm: Normal rate and regular rhythm.     Pulses: Normal pulses.     Heart sounds: Normal heart sounds.  Pulmonary:     Effort: Pulmonary effort is normal.     Breath sounds: Normal breath sounds.  Abdominal:     General: Abdomen is flat. Bowel sounds are normal.     Tenderness: There is no abdominal tenderness. There is no guarding.  Musculoskeletal:        General: Normal range of motion.     Cervical back: Normal range of  motion and neck supple.  Skin:    General: Skin is warm and dry.     Capillary Refill: Capillary refill takes less than 2 seconds.       Neurological:     General: No focal deficit present.     Mental Status: He is alert and oriented to person, place, and time.     Deep Tendon Reflexes: Reflexes normal.  Psychiatric:        Mood and Affect: Mood normal.        Behavior: Behavior normal.     ED Results / Procedures / Treatments   Labs (all labs ordered are listed, but only abnormal results are displayed) Labs Reviewed - No data to display  EKG None  Radiology No results found.  Procedures Procedures (including critical care time)  Medications Ordered in ED Medications  sulfamethoxazole-trimethoprim (BACTRIM DS) 800-160 MG per tablet 1 tablet (has no administration in time range)    ED Course  I have reviewed the triage vital signs and the nursing notes.  Pertinent labs & imaging results that were available during my care of the patient were reviewed by me and considered in my medical decision making (see chart for details).    Patient has a dehisced abscess with mild cellulitis and will treat with bactrim BS.  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  Patient verbalizes understanding and agrees to follow up.    Jermaine Obrien was evaluated in Emergency Department on 10/22/2019 for the symptoms described in the history of present illness. He was evaluated in the context of the global COVID-19 pandemic, which necessitated consideration that the patient might be at risk for infection with the SARS-CoV-2 virus that causes COVID-19. Institutional protocols and algorithms that pertain to the evaluation of patients at risk for COVID-19 are in a state of rapid change based on information released by regulatory bodies including the CDC and federal and state organizations. These policies and  algorithms were followed during the patient's care in the ED.  Final Clinical Impression(s) / ED Diagnoses Final diagnoses:  Skin infection  Tinea corporis  Return for intractable cough, coughing up blood,fevers >100.4 unrelieved by medication, shortness of breath, intractable vomiting, chest pain, shortness of breath, weakness,numbness, changes in speech, facial asymmetry,abdominal pain, passing out,Inability to tolerate liquids or food, cough, altered mental status or any concerns. No signs of systemic illness or infection. The patient is nontoxic-appearing on exam and vital signs are within normal limits.   I have reviewed the triage vital signs and the nursing notes. Pertinent labs &imaging results that were available during my care of the patient were reviewed by me  and considered in my medical decision making (see chart for details).After history, exam, and medical workup I feel the patient has beenappropriately medically screened and is safe for discharge home. Pertinent diagnoses were discussed with the patient. Patient was given return precautions.    Rx / DC Orders ED Discharge Orders         Ordered    sulfamethoxazole-trimethoprim (BACTRIM DS) 800-160 MG tablet  2 times daily     10/22/19 0455           Aviv Lengacher, MD 10/22/19 5364

## 2019-11-29 ENCOUNTER — Telehealth: Payer: Self-pay | Admitting: Family Medicine

## 2019-11-29 NOTE — Telephone Encounter (Signed)
Pt requesting that his 29 yr old son become new patient with Dr. Janice Norrie Danella Sensing 11/14/2014 [phone]

## 2019-11-30 NOTE — Telephone Encounter (Signed)
I'm fine with that. Ty.

## 2019-12-02 ENCOUNTER — Ambulatory Visit: Admitting: Medical

## 2020-01-04 ENCOUNTER — Ambulatory Visit: Admitting: Medical

## 2020-01-04 DIAGNOSIS — Z0289 Encounter for other administrative examinations: Secondary | ICD-10-CM

## 2020-02-07 ENCOUNTER — Other Ambulatory Visit: Payer: Self-pay

## 2020-02-07 ENCOUNTER — Ambulatory Visit (INDEPENDENT_AMBULATORY_CARE_PROVIDER_SITE_OTHER): Admitting: Family

## 2020-02-07 ENCOUNTER — Encounter: Payer: Self-pay | Admitting: Family

## 2020-02-07 VITALS — BP 120/74 | HR 62 | Temp 98.6°F | Resp 16 | Ht 70.5 in | Wt 190.4 lb

## 2020-02-07 DIAGNOSIS — S83006D Unspecified dislocation of unspecified patella, subsequent encounter: Secondary | ICD-10-CM

## 2020-02-07 DIAGNOSIS — M7021 Olecranon bursitis, right elbow: Secondary | ICD-10-CM | POA: Diagnosis not present

## 2020-02-07 DIAGNOSIS — M7701 Medial epicondylitis, right elbow: Secondary | ICD-10-CM

## 2020-02-07 DIAGNOSIS — Z8739 Personal history of other diseases of the musculoskeletal system and connective tissue: Secondary | ICD-10-CM | POA: Diagnosis not present

## 2020-02-07 MED ORDER — MELOXICAM 7.5 MG PO TABS: 7.5000 mg | ORAL_TABLET | Freq: Every day | ORAL | 0 refills | Status: DC

## 2020-02-07 NOTE — Patient Instructions (Signed)
You should be contacted about your referral to orthopedics. You may use meloxicam once daily as needed for elbow pain.  Welcome to [place]!
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
## 2020-02-28 ENCOUNTER — Ambulatory Visit: Payer: Self-pay

## 2020-02-28 ENCOUNTER — Other Ambulatory Visit: Payer: Self-pay

## 2020-02-28 ENCOUNTER — Encounter: Payer: Self-pay | Admitting: Orthopaedic Surgery

## 2020-02-28 ENCOUNTER — Ambulatory Visit (INDEPENDENT_AMBULATORY_CARE_PROVIDER_SITE_OTHER): Admitting: Orthopaedic Surgery

## 2020-02-28 DIAGNOSIS — M25562 Pain in left knee: Secondary | ICD-10-CM

## 2020-02-28 DIAGNOSIS — M25521 Pain in right elbow: Secondary | ICD-10-CM | POA: Diagnosis not present

## 2020-02-28 DIAGNOSIS — G8929 Other chronic pain: Secondary | ICD-10-CM

## 2020-02-28 MED ORDER — METHYLPREDNISOLONE 4 MG PO TABS: ORAL_TABLET | ORAL | 0 refills | Status: AC

## 2020-02-28 MED ORDER — DICLOFENAC SODIUM 75 MG PO TBEC: 75.0000 mg | DELAYED_RELEASE_TABLET | Freq: Two times a day (BID) | ORAL | 1 refills | Status: DC

## 2020-02-28 MED ORDER — MELOXICAM 7.5 MG PO TABS: 7.5000 mg | ORAL_TABLET | Freq: Every day | ORAL | 0 refills | Status: AC

## 2020-02-28 NOTE — Progress Notes (Signed)
Office Visit Note   Patient: Jermaine Obrien           Date of Birth: Feb 05, 1991           MRN: [id number] Visit Date: 02/28/2020              Requested by: Sandford Craze, NP [address] [place],  [place] [address] PCP: Sandford Craze, NP   Assessment & Plan: Visit Diagnoses:  1. Chronic pain of left knee   2. 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  I want him to rest from any high impact aerobic activities and want him to avoid any type of push-ups or pull-ups with the right arm.  I will start a 6-day steroid taper as well as some diclofenac.  We will see him back in return for the MRI of the left knee and will make further therapy plans accordingly.  All questions and concerns were answered and addressed.  Follow-Up Instructions: Return in about 2 weeks (around 03/13/2020).   Orders:  Orders Placed This Encounter  Procedures  . XR Knee 1-2 Views Left  . XR Elbow 2 Views Right   Meds ordered this encounter  Medications  . meloxicam (MOBIC) 7.5 MG tablet    Sig: Take 1 tablet (7.5 mg total) by mouth daily.    Dispense:  14 tablet    Refill:  0  . methylPREDNISolone (MEDROL) 4 MG tablet    Sig: Medrol dose pack. Take as instructed    Dispense:  21 tablet    Refill:  0  . diclofenac (VOLTAREN) 75 MG EC tablet    Sig: Take 1 tablet (75 mg total) by mouth 2 (two) times daily after a meal.    Dispense:  60 tablet    Refill:  1      Procedures: No procedures performed   Clinical Data: No additional findings.   Subjective: Chief Complaint  Patient presents with  . Right Elbow - Pain  The patient is a very pleasant 29 year old gentleman who comes in for evaluation treatment of right elbow pain but also left knee pain.  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  He said the pain really flared up  recently after training.  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  He also is right-hand dominant and points to pain around the triceps area and the medial epicondyle area of the right elbow is a source of his elbow pain.  He feels like the elbow is weak to him.  He denies any numbness and tingling in his hands or any instability of the knees other than the patella on the left side his been having more symptoms of instability and he gets a lot of swelling in his knees.  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  He is not a diabetic and has had no other acute change in medical status.  HPI  Review of Systems He currently denies any headache, chest pain, shortness of breath, fever, chills, nausea, vomiting  Objective: Vital Signs: There were no vitals taken for this visit.  Physical Exam He is alert and orient x3 and in no acute distress Ortho Exam  Examination of his right elbow does show some slight weakness and pain in the triceps near its insertion and there is pain over the just the medial epicondylar area of the elbow.  There is no pain over the cubital tunnel and a negative Tinel's sign.  His range of motion is full and the elbow feels ligamentously stable.  Examination of his left knee does show positive apprehension sign with the patella tracking laterally.  There is no knee joint effusion but definitely grinding of the patellofemoral joint which is significant. Specialty Comments:  No specialty comments available.  Imaging: XR Knee 1-2 Views Left  Result Date: 02/28/2020 2 views of the left knee show a laterally tracking patella and evidence of Osgood Slaughter as well as patellofemoral arthritic changes.  XR Elbow 2 Views Right  Result Date: 02/28/2020 2 views of the right elbow show no acute findings.    PMFS History: Patient Active Problem List   Diagnosis Date  Noted  . Routine general medical examination at a health care facility 10/28/2016   Past Medical History:  Diagnosis Date  . Closed fracture of rib of right side 04/14/2011  . Concussion   . Concussion syndrome 04/14/2011  . H/O Osgood-Schlatter disease   . Nasal fracture 04/14/2011  . Patellar dislocation   . Pneumothorax   . Pneumothorax, left 04/14/2011    Family History  Problem Relation Age of Onset  . Osteoarthritis Mother   . Diabetes Father   . Cancer Maternal Grandmother     No past surgical history on file. Social History   Occupational History  . Occupation: Brewing technologist: [employer]  . Occupation: Mudlogger  . Occupation: Electrical engineer  . Occupation: Company secretary reserve  Tobacco Use  . Smoking status: Never Smoker  . Smokeless tobacco: Never Used  Vaping Use  . Vaping Use: Never used  Substance and Sexual Activity  . Alcohol use: Yes    Alcohol/week: 1.0 standard drink    Types: 1 Shots of liquor per week    Comment: socially  . Drug use: No  . Sexual activity: Yes    Partners: Female

## 2020-03-08 ENCOUNTER — Other Ambulatory Visit: Payer: Self-pay

## 2020-03-08 ENCOUNTER — Ambulatory Visit (INDEPENDENT_AMBULATORY_CARE_PROVIDER_SITE_OTHER): Admitting: Family

## 2020-03-08 ENCOUNTER — Encounter: Payer: Self-pay | Admitting: Family

## 2020-03-08 VITALS — BP 113/72 | HR 50 | Temp 98.3°F | Resp 16 | Ht 70.5 in | Wt 186.0 lb

## 2020-03-08 DIAGNOSIS — R739 Hyperglycemia, unspecified: Secondary | ICD-10-CM

## 2020-03-08 DIAGNOSIS — Z0000 Encounter for general adult medical examination without abnormal findings: Secondary | ICD-10-CM | POA: Diagnosis not present

## 2020-03-08 DIAGNOSIS — M722 Plantar fascial fibromatosis: Secondary | ICD-10-CM

## 2020-03-08 NOTE — Progress Notes (Signed)
Subjective:    Patient ID: Jermaine Obrien, male    DOB: [birth year], 29 y.o.   MRN: [id number]  HPI  Patient presents today for complete physical.  Immunizations: He believes he had tetanus in the [employer] Diet: generally healthy Exercise:  Goes to the gym daily some cardio and weights Dental: up to date Vision: due  He c/o right heel pain, intermittent  Review of Systems  Constitutional: Negative for unexpected weight change.  HENT: Negative for hearing loss and rhinorrhea.        Reports occasional tinnitis after he was working around the [employer], wears Advertising copywriter Feels like hearing is OK  Eyes: Negative for visual disturbance.  Respiratory: Negative for cough and shortness of breath.   Cardiovascular: Negative for chest pain.  Gastrointestinal: Negative for blood in stool, constipation and diarrhea.  Genitourinary: Negative for difficulty urinating and frequency.  Musculoskeletal: Negative for arthralgias (hx of knee and elbow issues) and myalgias.       Some occasional right heel pain,    Neurological: Positive for headaches (very rarely).  Hematological: Negative for adenopathy.  Psychiatric/Behavioral:       Denies depression/anxiety   Past Medical History:  Diagnosis Date   Closed fracture of rib of right side 04/14/2011   Concussion    Concussion syndrome 04/14/2011   H/O Osgood-Schlatter disease    Nasal fracture 04/14/2011   Patellar dislocation    Pneumothorax    Pneumothorax, left 04/14/2011     Social History   Socioeconomic History   Marital status: Single    Spouse name: N/A   Number of children: 2   Years of education: 12th grade   Highest education level: Not on file  Occupational History   Occupation: Naval architect    Comment: [employer]   Occupation: Mudlogger   Occupation: security guard   Occupation: Company secretary reserve  Tobacco Use   Smoking status: Never Smoker   Smokeless tobacco: Never Used  Haematologist Use: Never used  Substance and Sexual Activity   Alcohol use: Yes    Alcohol/week: 1.0 standard drink    Types: 1 Shots of liquor per week    Comment: socially   Drug use: No   Sexual activity: Yes    Partners: Female  Other Topics Concern   Not on file  Social History Narrative   Lives alone.   He shares custody of his son.   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   He is in the reserves   Does security for a night club   Single    Two sons    2013- Aadan Chenier   2016- Carlitos Bottino   Enjoys working out at the [employer], has some college credits   Social Determinants of Corporate investment banker Strain:    Difficulty of Paying Living Expenses: Not on [employer] Insecurity:    Worried About Programme researcher, broadcasting/film/video in the Last Year: Not on file   [employer] of Food in the Last Year: Not on file  Transportation Needs: No Transportation Needs   Lack of Transportation (Medical): No   Lack of Transportation (Non-Medical): No  Physical Activity: Sufficiently Active   Days of Exercise per Week: 5 days   Minutes of Exercise per Session: 60 min  Stress:    Feeling of Stress : Not on file  Social Connections:    Frequency  of Communication with Friends and Family: Not on file   Frequency of Social Gatherings with Friends and Family: Not on file   Attends Religious Services: Not on file   Active Member of Clubs or Organizations: Not on file   Attends Banker Meetings: Not on file   Marital Status: Not on file  Intimate Partner Violence:    Fear of Current or Ex-Partner: Not on file   Emotionally Abused: Not on file   Physically Abused: Not on file   Sexually Abused: Not on file    No past surgical history on file.  Family History  Problem Relation Age of Onset   Osteoarthritis Mother    Diabetes Father    Cancer Maternal Grandmother     Allergies  Allergen Reactions   Pheniramine-Pe-Apap  Hives and Other (See Comments)    Pt states allergy to only the wild berry flavor of theraflu    Current Outpatient Medications on File Prior to Visit  Medication Sig Dispense Refill   meloxicam (MOBIC) 7.5 MG tablet Take 1 tablet (7.5 mg total) by mouth daily. 14 tablet 0   methylPREDNISolone (MEDROL) 4 MG tablet Medrol dose pack. Take as instructed 21 tablet 0   No current facility-administered medications on file prior to visit.    BP 113/72 (BP Location: Right Arm, Patient Position: Sitting, Cuff Size: Small)    Pulse (!) 50    Temp 98.3 F (36.8 C) (Oral)    Resp 16    Ht 5' 10.5" (1.791 m)    Wt 186 lb (84.4 kg)    SpO2 97%    BMI 26.31 kg/m        Objective:   Physical Exam  Physical Exam  Constitutional: He is oriented to person, place, and time. He appears well-developed and well-nourished. No distress.  HENT:  Head: Normocephalic and atraumatic.  Right Ear: Tympanic membrane and ear canal normal.  Left Ear: Tympanic membrane and ear canal normal.  Mouth/Throat: Oropharynx is clear and moist.  Eyes: Pupils are equal, round, and reactive to light. No scleral icterus.  Neck: Normal range of motion. No thyromegaly present.  Cardiovascular: Normal rate and regular rhythm.   No murmur heard. Pulmonary/Chest: Effort normal and breath sounds normal. No respiratory distress. He has no wheezes. He has no rales. He exhibits no tenderness.  Abdominal: Soft. Bowel sounds are normal. He exhibits no distension and no mass. There is no tenderness. There is no rebound and no guarding.  Musculoskeletal: He exhibits no edema.  Lymphadenopathy:    He has no cervical adenopathy.  Neurological: He is alert and oriented to person, place, and time. He has normal patellar reflexes. He exhibits normal muscle tone. Coordination normal.  Skin: Skin is warm and dry.  Psychiatric: He has a normal mood and affect. His behavior is normal. Judgment and thought content normal.             Assessment & Plan:  Preventative care- he will send me a copy of his military vaccine record.  Immunizations are reportedly up to date. Encouraged him to continue healthy diet, exercise.    Plantar fasciitis- new. Advised icing, supportive footwear, sparing use of prn ibuprofen.  Hyperglycemia- noted on remote labs 2012- check CMET.  This visit occurred during the SARS-CoV-2 public health emergency.  Safety protocols were in place, including screening questions prior to the visit, additional usage of staff PPE, and extensive cleaning of exam room while observing appropriate contact time as indicated  for disinfecting solutions.         Assessment & Plan:

## 2020-03-08 NOTE — Patient Instructions (Addendum)
Please schedule a routine eye exam. Continue healthy diet and regular exercise.   Preventive Care 53-28 Years Old, Male Preventive care refers to lifestyle choices and visits with your health care provider that can promote health and wellness. This includes:  A yearly physical exam. This is also called an annual well check.  Regular dental and eye exams.  Immunizations.  Screening for certain conditions.  Healthy lifestyle choices, such as eating a healthy diet, getting regular exercise, not using drugs or products that contain nicotine and tobacco, and limiting alcohol use. What can I expect for my preventive care visit? Physical exam Your health care provider will check:  Height and weight. These may be used to calculate body mass index (BMI), which is a measurement that tells if you are at a healthy weight.  Heart rate and blood pressure.  Your skin for abnormal spots. Counseling Your health care provider may ask you questions about:  Alcohol, tobacco, and drug use.  Emotional well-being.  Home and relationship well-being.  Sexual activity.  Eating habits.  Work and work Statistician. What immunizations do I need?  Influenza (flu) vaccine  This is recommended every year. Tetanus, diphtheria, and pertussis (Tdap) vaccine  You may need a Td booster every 10 years. Varicella (chickenpox) vaccine  You may need this vaccine if you have not already been vaccinated. Human papillomavirus (HPV) vaccine  If recommended by your health care provider, you may need three doses over 6 months. Measles, mumps, and rubella (MMR) vaccine  You may need at least one dose of MMR. You may also need a second dose. Meningococcal conjugate (MenACWY) vaccine  One dose is recommended if you are 89-73 years old and a Market researcher living in a residence hall, or if you have one of several medical conditions. You may also need additional booster doses. Pneumococcal  conjugate (PCV13) vaccine  You may need this if you have certain conditions and were not previously vaccinated. Pneumococcal polysaccharide (PPSV23) vaccine  You may need one or two doses if you smoke cigarettes or if you have certain conditions. Hepatitis A vaccine  You may need this if you have certain conditions or if you travel or work in places where you may be exposed to hepatitis A. Hepatitis B vaccine  You may need this if you have certain conditions or if you travel or work in places where you may be exposed to hepatitis B. Haemophilus influenzae type b (Hib) vaccine  You may need this if you have certain risk factors. You may receive vaccines as individual doses or as more than one vaccine together in one shot (combination vaccines). Talk with your health care provider about the risks and benefits of combination vaccines. What tests do I need? Blood tests  Lipid and cholesterol levels. These may be checked every 5 years starting at age 38.  Hepatitis C test.  Hepatitis B test. Screening   Diabetes screening. This is done by checking your blood sugar (glucose) after you have not eaten for a while (fasting).  Sexually transmitted disease (STD) testing. Talk with your health care provider about your test results, treatment options, and if necessary, the need for more tests. Follow these instructions at home: Eating and drinking   Eat a diet that includes fresh fruits and vegetables, whole grains, lean protein, and low-fat dairy products.  Take vitamin and mineral supplements as recommended by your health care provider.  Do not drink alcohol if your health care provider tells you not to  drink.  If you drink alcohol: ? Limit how much you have to 0-2 drinks a day. ? Be aware of how much alcohol is in your drink. In the [place] one drink equals one 12 oz bottle of beer (355 mL), one 5 oz glass of wine (148 mL), or one 1 oz glass of hard liquor (44 mL). Lifestyle  Take  daily care of your teeth and gums.  Stay active. Exercise for at least 30 minutes on 5 or more days each week.  Do not use any products that contain nicotine or tobacco, such as cigarettes, e-cigarettes, and chewing tobacco. If you need help quitting, ask your health care provider.  If you are sexually active, practice safe sex. Use a condom or other form of protection to prevent STIs (sexually transmitted infections). What's next?  Go to your health care provider once a year for a well check visit.  Ask your health care provider how often you should have your eyes and teeth checked.  Stay up to date on all vaccines. This information is not intended to replace advice given to you by your health care provider. Make sure you discuss any questions you have with your health care provider. Document Revised: 05/21/2018 Document Reviewed: 05/21/2018 Elsevier Patient Education  2020 [employer].

## 2020-03-09 LAB — COMPREHENSIVE METABOLIC PANEL
AG Ratio: 1.7 (calc) (ref 1.0–2.5)
ALT: 11 U/L (ref 9–46)
AST: 17 U/L (ref 10–40)
Albumin: 4.3 g/dL (ref 3.6–5.1)
Alkaline phosphatase (APISO): 59 U/L (ref 36–130)
BUN: 13 mg/dL (ref 7–25)
CO2: 27 mmol/L (ref 20–32)
Calcium: 9.2 mg/dL (ref 8.6–10.3)
Chloride: 105 mmol/L (ref 98–110)
Creat: 0.96 mg/dL (ref 0.60–1.35)
Globulin: 2.6 g/dL (calc) (ref 1.9–3.7)
Glucose, Bld: 91 mg/dL (ref 65–99)
Potassium: 3.9 mmol/L (ref 3.5–5.3)
Sodium: 140 mmol/L (ref 135–146)
Total Bilirubin: 0.8 mg/dL (ref 0.2–1.2)
Total Protein: 6.9 g/dL (ref 6.1–8.1)

## 2020-03-13 ENCOUNTER — Ambulatory Visit (INDEPENDENT_AMBULATORY_CARE_PROVIDER_SITE_OTHER): Admitting: Orthopaedic Surgery

## 2020-03-13 DIAGNOSIS — G8929 Other chronic pain: Secondary | ICD-10-CM

## 2020-03-13 DIAGNOSIS — M25562 Pain in left knee: Secondary | ICD-10-CM

## 2020-04-07 ENCOUNTER — Ambulatory Visit
Admission: RE | Admit: 2020-04-07 | Discharge: 2020-04-07 | Disposition: A | Discharge status: Home / Self Care | Source: Ambulatory Visit | Attending: Orthopaedic Surgery | Admitting: Orthopaedic Surgery

## 2020-04-07 DIAGNOSIS — G8929 Other chronic pain: Secondary | ICD-10-CM

## 2020-04-10 ENCOUNTER — Ambulatory Visit: Admitting: Orthopaedic Surgery

## 2022-07-26 IMAGING — MR MR KNEE*L* W/O CM
5 of 6 series · 28 of 40 positions shown · non-contrast
Comparison: Radiographs 03/07/2020

CLINICAL DATA: Left knee pain and instability. History of patellar
dislocation.

EXAM:
MRI OF THE LEFT KNEE WITHOUT CONTRAST
TECHNIQUE: Multiplanar, multisequence MR imaging of the knee was performed. No
intravenous contrast was administered.

[Series 3: T2 fat-sat · axial · 4.0mm · 0.50mm/px · z∈[-78,+37]mm · 7 of 24 slices shown (1 of 3)]
[im 1/24]
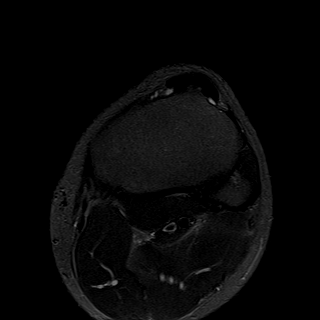
[im 4/24]
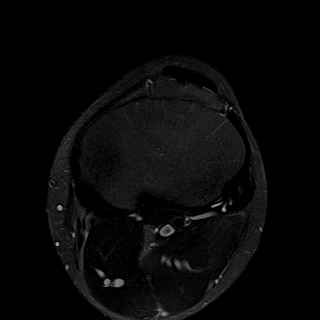
[im 8/24]
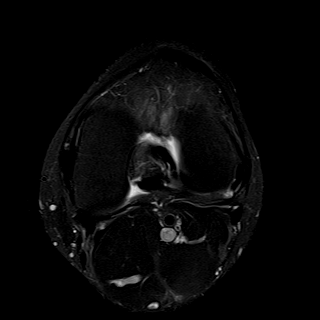
[im 12/24]
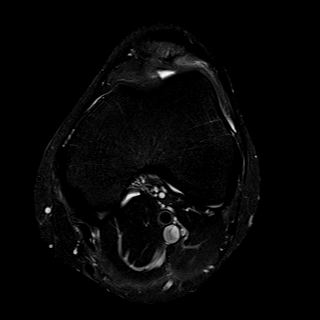
[im 16/24]
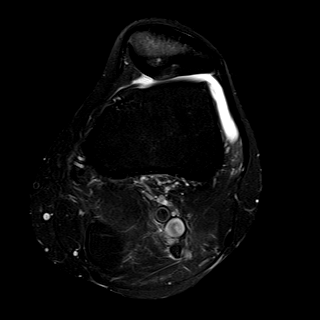
[im 20/24]
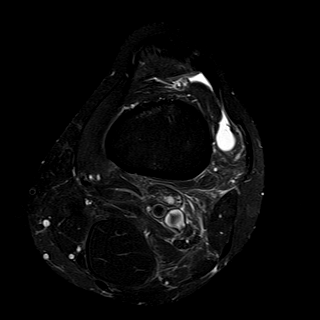
[im 24/24]
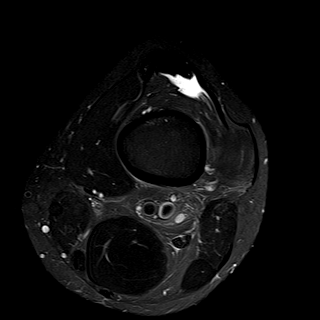

[Series 5: T2 fat-sat · coronal · 4.0mm · 0.39mm/px · 6 of 24 slices shown (2 of 3)]
[im 1/24]
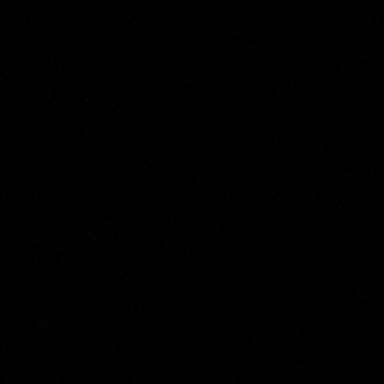
[im 5/24]
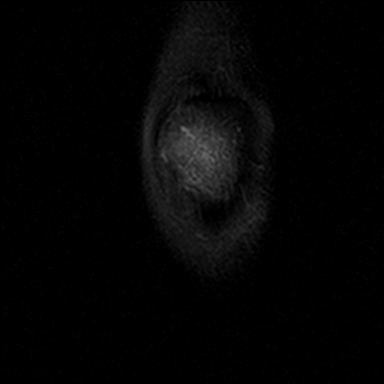
[im 10/24]
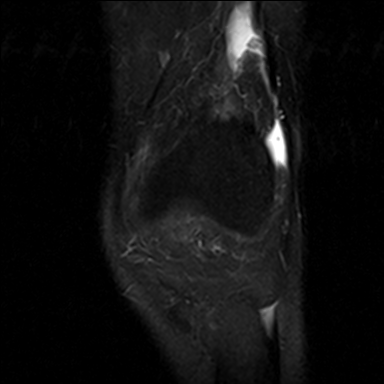
[im 14/24]
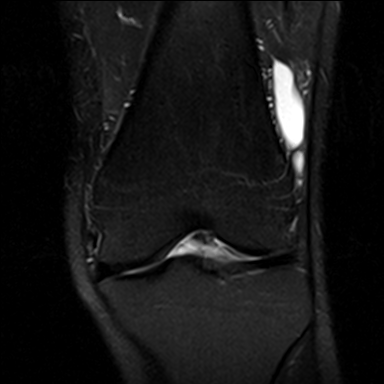
[im 19/24]
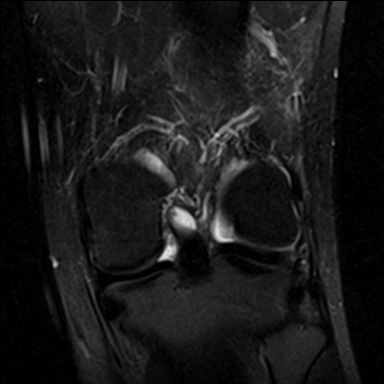
[im 24/24]
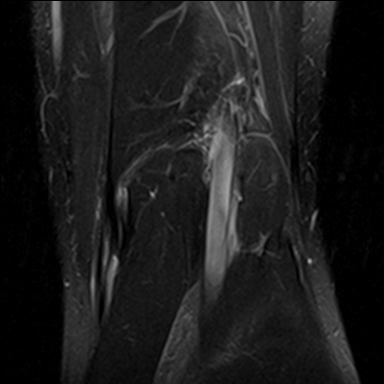

[Series 6: T2 fat-sat · sagittal · 3.0mm · 0.29mm/px · 2 of 27 slices shown (3 of 3)]
[im 1/27]
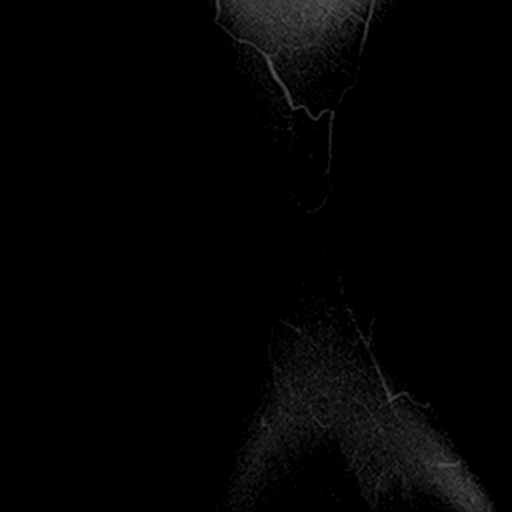
[im 5/27]
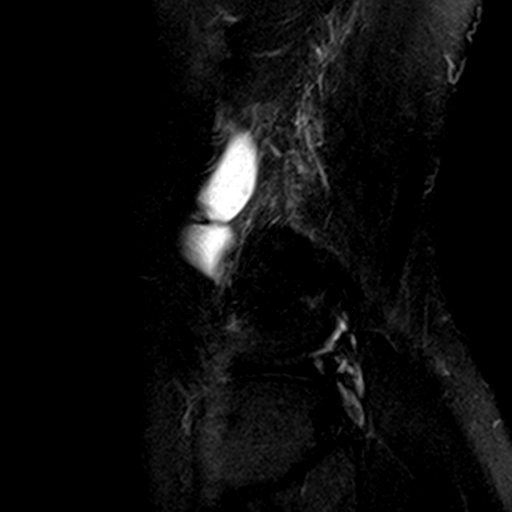

[Series 7: PD fat-sat · sagittal · 3.0mm · 0.29mm/px · 7 of 27 slices shown (1 of 2)]
[im 1/27]
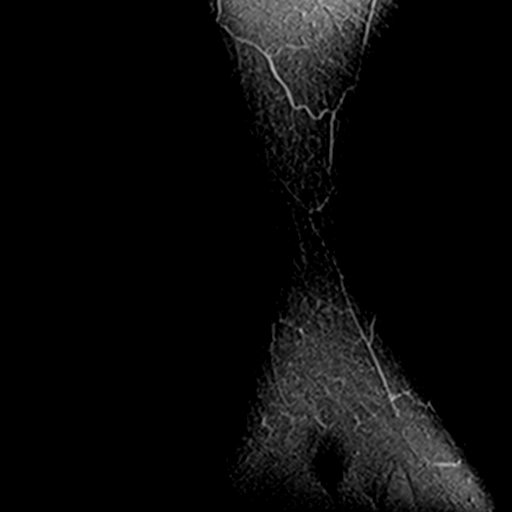
[im 5/27]
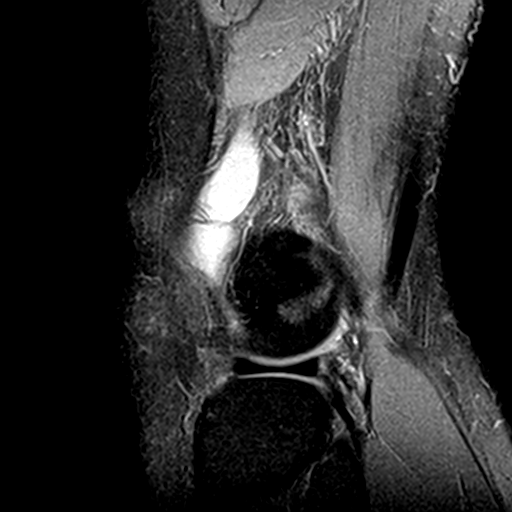
[im 9/27]
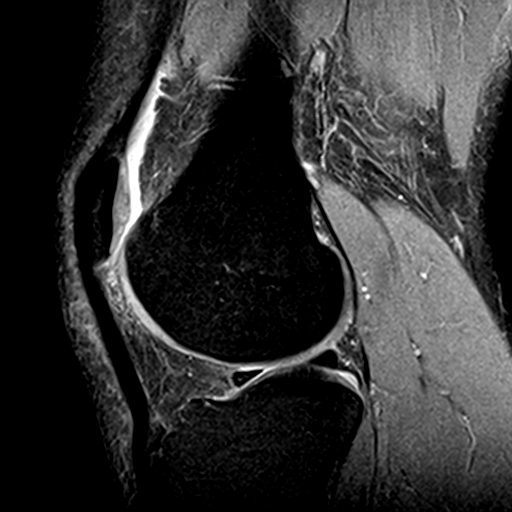
[im 14/27]
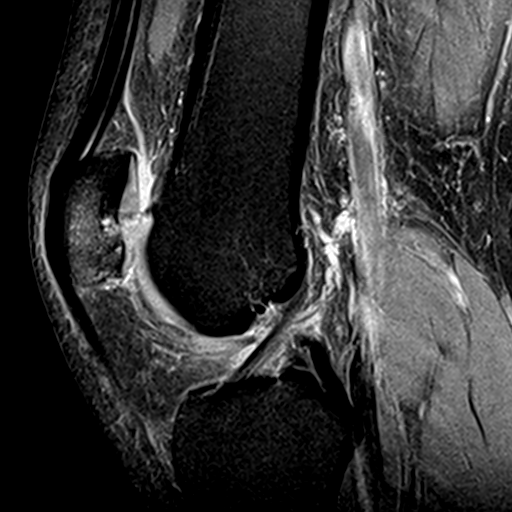
[im 18/27]
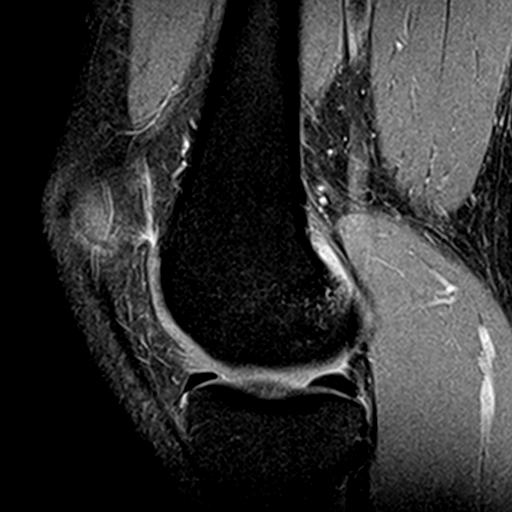
[im 22/27]
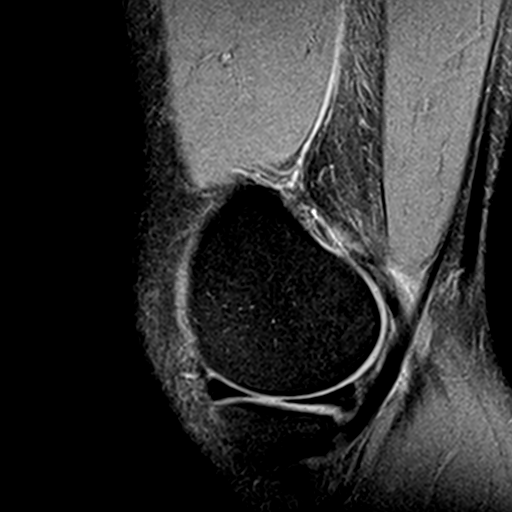
[im 27/27]
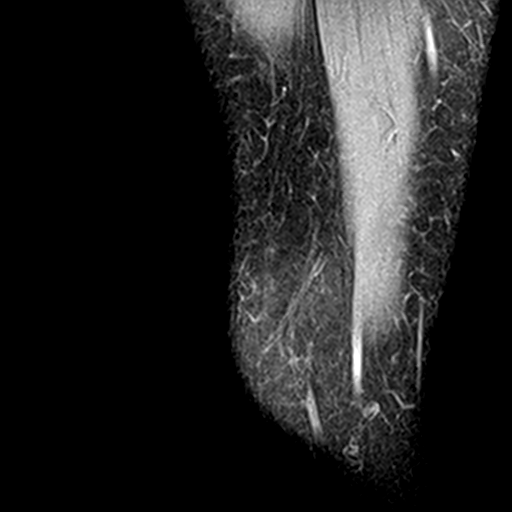

[Series 8: PD fat-sat · coronal · 4.0mm · 0.39mm/px · 6 of 24 slices shown (2 of 2)]
[im 1/24]
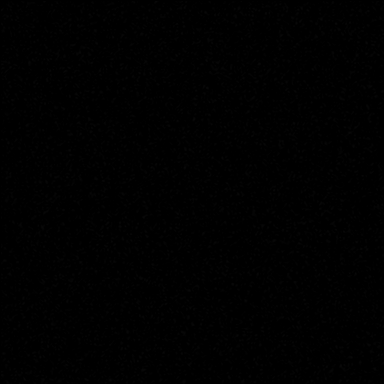
[im 5/24]
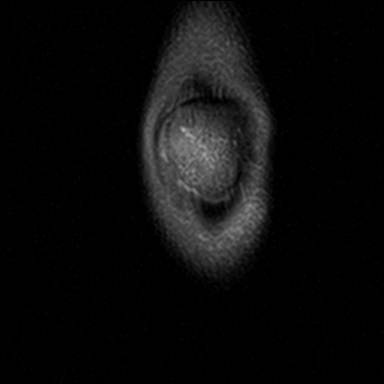
[im 10/24]
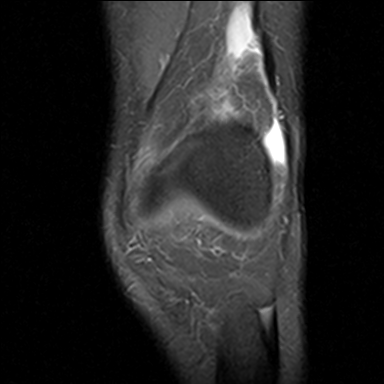
[im 14/24]
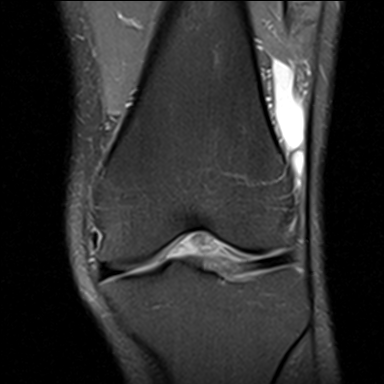
[im 19/24]
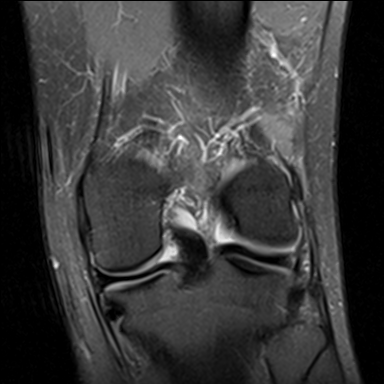
[im 24/24]
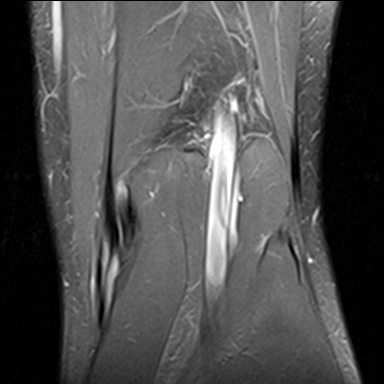

[28 of 40 positions shown; findings below may reference images not displayed]

FINDINGS: MENISCI

Medial meniscus:  Intact

Lateral meniscus:  Intact

LIGAMENTS

Cruciates:  Intact

Collaterals:  Intact

CARTILAGE

Patellofemoral: Chondral injury involving the lateral aspect of the
patellar apex. Significant chondral contusion and deep chondral
fissure without discrete cartilage defect or osteochondral
abnormality. Mild marrow edema noted in the patella.

Medial:  Normal

Lateral:  Normal

Joint:  Small joint effusion.

Popliteal Fossa:  No popliteal mass or Baker's cyst.

Extensor Mechanism: The patella retinacular structures are intact.
No findings for retinacular tear. The medial patellofemoral ligament
is intact. There is slight lateral orientation of the patella in
relation to the femoral trochlear groove and the TT-TG distance is
abnormal at 21 mm.

Bones: Edema like signal changes in the patella could be a bone
contusion. I do not see an associated lateral femoral bone
contusion. No bone lesions.

Other: Unremarkable knee musculature.
IMPRESSION: 1. Chondral injury/contusion involving the lateral aspect of the
patellar apex without discrete cartilage defect or osteochondral
abnormality.
2. Intact ligamentous structures and no meniscal tears.
3. Abnormal TT-TG distance of 21 mm.
4. Edema like signal changes in the patella could be a bone
contusion. No associated lateral femoral bone contusion.
5. Small joint effusion.

## 2024-03-10 ENCOUNTER — Emergency Department (HOSPITAL_BASED_OUTPATIENT_CLINIC_OR_DEPARTMENT_OTHER)
Admission: EM | Admit: 2024-03-10 | Discharge: 2024-03-10 | Disposition: A | Discharge status: Home / Self Care | Payer: Self-pay | Attending: Emergency Medicine | Admitting: Emergency Medicine

## 2024-03-10 ENCOUNTER — Other Ambulatory Visit: Payer: Self-pay

## 2024-03-10 ENCOUNTER — Encounter (HOSPITAL_BASED_OUTPATIENT_CLINIC_OR_DEPARTMENT_OTHER): Payer: Self-pay | Admitting: Emergency Medicine

## 2024-03-10 DIAGNOSIS — L731 Pseudofolliculitis barbae: Secondary | ICD-10-CM | POA: Insufficient documentation

## 2024-03-10 NOTE — ED Provider Notes (Signed)
 [facility] Provider Note   CSN: [id number] Arrival date & time: 03/10/24  0130     Patient presents with: Rash   Jermaine Obrien is a 33 y.o. male.   The history is provided by the patient.  Wound Check This is a new problem. The current episode started more than 2 days ago. The problem occurs constantly. The problem has been rapidly improving. Pertinent negatives include no chest pain, no abdominal pain, no headaches and no shortness of breath. Nothing aggravates the symptoms. Nothing relieves the symptoms. He has tried nothing for the symptoms. The treatment provided moderate relief.  A wound (scab) on the posterior right shoulder is concerned for MRSA abscess.       Prior to Admission medications   Medication Sig Start Date End Date Taking? Authorizing Provider  meloxicam  (MOBIC ) 7.5 MG tablet Take 1 tablet (7.5 mg total) by mouth daily. 02/28/20   Vernetta Lonni GRADE, MD  methylPREDNISolone  (MEDROL ) 4 MG tablet Medrol  dose pack. Take as instructed 02/28/20   Vernetta Lonni GRADE, MD    Allergies: Pheniramine-pe-apap    Review of Systems  Respiratory:  Negative for shortness of breath.   Cardiovascular:  Negative for chest pain.  Gastrointestinal:  Negative for abdominal pain.  Neurological:  Negative for headaches.  All other systems reviewed and are negative.   Updated Vital Signs BP 128/86 (BP Location: Right Arm)   Pulse 71   Temp 98.4 F (36.9 C) (Oral)   Resp 20   SpO2 100%   Physical Exam Vitals and nursing note reviewed.  Constitutional:      General: He is not in acute distress.    Appearance: He is well-developed. He is not diaphoretic.  HENT:     Head: Normocephalic and atraumatic.     Nose: Nose normal.  Eyes:     Conjunctiva/sclera: Conjunctivae normal.     Pupils: Pupils are equal, round, and reactive to light.  Cardiovascular:     Rate and Rhythm: Normal rate and regular rhythm.  Pulmonary:      Effort: Pulmonary effort is normal.     Breath sounds: Normal breath sounds. No wheezing or rales.  Abdominal:     General: Bowel sounds are normal.     Palpations: Abdomen is soft.     Tenderness: There is no abdominal tenderness. There is no guarding or rebound.  Musculoskeletal:        General: Normal range of motion.     Cervical back: Normal range of motion and neck supple.  Skin:    General: Skin is warm and dry.     Capillary Refill: Capillary refill takes less than 2 seconds.      Neurological:     General: No focal deficit present.     Mental Status: He is alert and oriented to person, place, and time.  Psychiatric:        Mood and Affect: Mood normal.     (all labs ordered are listed, but only abnormal results are displayed) Labs Reviewed - No data to display  EKG: None  Radiology: No results found.   Procedures   Medications Ordered in the ED - No data to display                                  Medical Decision Making Area on R shoulder   Amount and/or Complexity of  Data Reviewed External Data Reviewed: notes.    Details: Previous notes reviewed   Risk Risk Details: Area was an ingrown hair that has now come through skin.  There area is not infected.  Stable for discharge with close follow up     Final diagnoses:  Ingrown hair   No signs of systemic illness or infection. The patient is nontoxic-appearing on exam and vital signs are within normal limits.  I have reviewed the triage vital signs and the nursing notes. Pertinent labs & imaging results that were available during my care of the patient were reviewed by me and considered in my medical decision making (see chart for details). After history, exam, and medical workup I feel the patient has been appropriately medically screened and is safe for discharge home. Pertinent diagnoses were discussed with the patient. Patient was given return precautions.    ED Discharge Orders     None           Ferman Basilio, MD 03/10/24 [id number]

## 2024-03-10 NOTE — ED Triage Notes (Signed)
 Pt thinks that he has mrsa on his right shoulder.
